# Patient Record
Sex: Male | Born: 1985 | Race: Black or African American | Hispanic: No | Marital: Single | Smoking: Current some day smoker
Health system: Southern US, Community
[De-identification: ages and names within clinical notes are randomized; demographics above are authoritative.]

## PROBLEM LIST (undated history)

## (undated) DIAGNOSIS — T8859XA Other complications of anesthesia, initial encounter: Secondary | ICD-10-CM

## (undated) DIAGNOSIS — Z8489 Family history of other specified conditions: Secondary | ICD-10-CM

## (undated) DIAGNOSIS — Z789 Other specified health status: Secondary | ICD-10-CM

## (undated) HISTORY — PX: TYMPANOSTOMY TUBE PLACEMENT: SHX32

## (undated) HISTORY — PX: TONSILLECTOMY: SUR1361

## (undated) HISTORY — PX: KNEE SURGERY: SHX244

---

## 2013-08-10 ENCOUNTER — Encounter (HOSPITAL_COMMUNITY): Payer: Self-pay | Admitting: Emergency Medicine

## 2013-08-10 ENCOUNTER — Emergency Department (HOSPITAL_COMMUNITY)
Admission: EM | Admit: 2013-08-10 | Discharge: 2013-08-10 | Disposition: A | Discharge status: Home / Self Care | Payer: BC Managed Care – PPO | Attending: Emergency Medicine | Admitting: Emergency Medicine

## 2013-08-10 DIAGNOSIS — Z791 Long term (current) use of non-steroidal anti-inflammatories (NSAID): Secondary | ICD-10-CM | POA: Insufficient documentation

## 2013-08-10 DIAGNOSIS — G5621 Lesion of ulnar nerve, right upper limb: Secondary | ICD-10-CM

## 2013-08-10 DIAGNOSIS — F17200 Nicotine dependence, unspecified, uncomplicated: Secondary | ICD-10-CM | POA: Insufficient documentation

## 2013-08-10 DIAGNOSIS — M5412 Radiculopathy, cervical region: Secondary | ICD-10-CM | POA: Insufficient documentation

## 2013-08-10 DIAGNOSIS — M542 Cervicalgia: Secondary | ICD-10-CM | POA: Insufficient documentation

## 2013-08-10 DIAGNOSIS — R209 Unspecified disturbances of skin sensation: Secondary | ICD-10-CM | POA: Insufficient documentation

## 2013-08-10 DIAGNOSIS — IMO0002 Reserved for concepts with insufficient information to code with codable children: Secondary | ICD-10-CM | POA: Insufficient documentation

## 2013-08-10 DIAGNOSIS — M5410 Radiculopathy, site unspecified: Secondary | ICD-10-CM

## 2013-08-10 MED ORDER — NAPROXEN 500 MG PO TABS: 500.0000 mg | ORAL_TABLET | Freq: Two times a day (BID) | ORAL | Status: DC

## 2013-08-10 MED ORDER — OXYCODONE-ACETAMINOPHEN 5-325 MG PO TABS: 1.0000 | ORAL_TABLET | Freq: Three times a day (TID) | ORAL | Status: DC | PRN

## 2013-08-10 NOTE — ED Notes (Signed)
Pt states that when he moves neck fast a sharp pain will shoot from his neck to his back, and now he states it hurts to make a fist with his right hand and hurts when finger tips on right hand are touched, no pain in left hand.

## 2013-08-10 NOTE — ED Provider Notes (Signed)
CSN: [id number]     Arrival date & time 08/10/13  4098 History   First MD Initiated Contact with Patient 08/10/13 0730     Chief Complaint  Patient presents with  . Back Pain    lower  . Hand Pain    right   (Consider location/radiation/quality/duration/timing/severity/associated sxs/prior Treatment) The history is provided by the patient. No language interpreter was used.  Fran Mcree is a 27 y/o M presenting to the ED with neck pain that has been ongoing for the past month with lower back pain and hand discomfort that has been ongoing since Saturday. Patient reported that on Saturday he was jumping up onto his truck and when he jumped onto his truck he noticed that he had pain radiating from his back to his legs bilaterally - reported that this lasted for only 2-3 minutes. Patient reported that neck pain started one month ago and stated that when he was playing basketball he looked up quickly and had neck pain. Patient reported that since Saturday he has been having tingling sensation to the right hand - radiating from the right elbow to the hand. Patient reported that in the morning the tingling and the numbness is worse. Patient reported that he feels a pressure in his hand - stated that as the day continues he does no feel that much discomfort. 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 Patient reported that he feels the tingling sensation in his small, ring, and long finger of the right hand mainly. Denied neck stiffness, fever, chills, loss of sensation, visual distortions, weakness, loss of grip, injuries, falls, dizziness, headaches. PCP none   History reviewed. No pertinent past medical history. Past Surgical History  Procedure Laterality Date  . Tonsillectomy    . Knee surgery Left    No family history on file. History  Substance Use Topics  . Smoking status: Current Some Day Smoker   Types: Cigars  . Smokeless tobacco: Not on file  . Alcohol Use: No    Review of Systems  Constitutional: Negative for fever and chills.  HENT: Positive for neck pain. Negative for neck stiffness.   Eyes: Negative for visual disturbance.  Neurological: Positive for numbness. Negative for dizziness, weakness and headaches.  All other systems reviewed and are negative.    Allergies  Review of patient's allergies indicates no known allergies.  Home Medications   Current Outpatient Rx  Name  Route  Sig  Dispense  Refill  . ibuprofen (ADVIL,MOTRIN) 200 MG tablet   Oral   Take 800 mg by mouth every 6 (six) hours as needed for pain.         . naproxen sodium (ANAPROX) 220 MG tablet   Oral   Take 220 mg by mouth 2 (two) times daily with a meal.         . naproxen (NAPROSYN) 500 MG tablet   Oral   Take 1 tablet (500 mg total) by mouth 2 (two) times daily.   30 tablet   0   . oxyCODONE-acetaminophen (PERCOCET/ROXICET) 5-325 MG per tablet   Oral   Take 1 tablet by mouth every 8 (eight) hours as needed for pain.   5 tablet   0    BP 148/74  Pulse 72  Temp(Src) 98.5 F (36.9 C) (Oral)  Resp 20  SpO2 99% Physical Exam  Nursing note and vitals reviewed. Constitutional: He is  oriented to person, place, and time. He appears well-developed and well-nourished. No distress.  HENT:  Head: Normocephalic and atraumatic.  Eyes: Conjunctivae and EOM are normal. Pupils are equal, round, and reactive to light. Right eye exhibits no discharge. Left eye exhibits no discharge.  Neck: Normal range of motion. Neck supple.  Negative neck stiffness  Negative nuchal rigidity  Cardiovascular: Normal rate, regular rhythm and normal heart sounds.  Exam reveals no friction rub.   No murmur heard. Pulses:      Radial pulses are 2+ on the right side, and 2+ on the left side.       Dorsalis pedis pulses are 2+ on the right side, and 2+ on the left side.  Pulmonary/Chest: Effort normal and  breath sounds normal. No respiratory distress. He has no wheezes. He has no rales.  Musculoskeletal: Normal range of motion. He exhibits no tenderness.       Back:  Negative swelling, erythema, inflammation, ecchymosis, deformities noted to the right upper extremity and back.  Full ROM to upper and lower extremities  Full motion of the spine noted without difficulty Mild discomfort upon palpation to the right side of the lumbosacral region - mainly muscular in nature.  Neurological: He is alert and oriented to person, place, and time. No cranial nerve deficit. He exhibits normal muscle tone. Coordination normal.  Cranial nerves III-XII grossly intact  Sensation intact to upper and lower extremities bilaterally with differentiation to sharp and dull touch  Strength 5+/5+ to upper and lower extremities bilaterally with resistance, equal distribution identified Strength intact to MCP, PIP, and DIP joints of the digits of the right hand Full sensation and strength intact to the right hand and digits.   Skin: Skin is warm and dry. No rash noted. He is not diaphoretic. No erythema.  Psychiatric: He has a normal mood and affect. His behavior is normal. Thought content normal.    ED Course  Procedures (including critical care time)  8:00 AM Discussed case with Dr. Blinda Leatherwood, reported that imaging is not warranted at this time since there was no actual trauma. Recommended that patient be discharged with pain medications and follow-up as an outpatient to be get further imaging performed if symptoms worsen or change.   8:30 AM Had a long discussion with patient regarding no imaging required at this juncture. Discussed with patient that his pain is predominantly radiculopathy, nerve pain. Discussed with patient to try and sleep with his arm not tucked underneath his head or bent, discussed with patient to try and keep the arm straight. Discussed with patient that he is to monitor symptoms closely. Discussed  with patient to follow-up with orthopedics and PCP - health and wellness - discussed possible need for MRI in the future if discomfort is to continue or change/worsen.   Labs Review Labs Reviewed - No data to display Imaging Review No results found.  MDM   1. Radiculopathy   2. Ulnar nerve impingement, right     Patient presenting to the ED with right hand pain and numbness sensation to the right long, ring and small finger that has been ongoing since Saturday. Patient reported that he has been experiencing lower back pain that is exacerbated with quick turning of the neck - reported that the pain radiates down the neck to the back. Denied fall, trauma, injuries. Alert and oriented. Full ROM to upper and lower extremities. Strength and sensation intact to upper and lower extremities. Strength with equal distribution noted. Full sensation  and strength to the right hand - full strength to the PIP, DIP, and MCP joints of the right hand. Pulses palpable and strong.  Discussed case with Dr. Blinda Leatherwood, as per physician did not recommend imaging at this time since there was no physical trauma. As per physician, cleared patient for discharge - recommended patient to be given pain medications.  Patient stable, afebrile. Suspicion to be radiculopathy and possible ulnar nerve impingement. Discharged patient with pain medication. Discussed with patient course and precautions. Discussed with patient to rest and stay hydrated. Referred patient to health and wellness center and orthopedics. Discussed with patient to continue to monitor symptoms and if symptoms are to worsen or change to report back to the ED - strict return instructions given.  Patient agreed to plan of care, understood, all questions answered.     Raymon Mutton, PA-C 08/11/13 2023

## 2013-08-12 NOTE — ED Provider Notes (Signed)
Medical screening examination/treatment/procedure(s) were performed by non-physician practitioner and as supervising physician I was immediately available for consultation/collaboration.    Jaxsen Bernhart J. Valery Chance, MD 08/12/13 0707 

## 2013-09-12 ENCOUNTER — Ambulatory Visit: Payer: BC Managed Care – PPO | Admitting: Internal Medicine

## 2019-08-06 ENCOUNTER — Other Ambulatory Visit: Payer: Self-pay

## 2019-08-06 DIAGNOSIS — Z20822 Contact with and (suspected) exposure to covid-19: Secondary | ICD-10-CM

## 2019-08-07 LAB — NOVEL CORONAVIRUS, NAA: SARS-CoV-2, NAA: NOT DETECTED

## 2019-09-07 ENCOUNTER — Other Ambulatory Visit: Payer: Self-pay | Admitting: Cardiology

## 2019-09-07 DIAGNOSIS — Z20822 Contact with and (suspected) exposure to covid-19: Secondary | ICD-10-CM

## 2019-09-09 LAB — NOVEL CORONAVIRUS, NAA: SARS-CoV-2, NAA: NOT DETECTED

## 2019-11-26 ENCOUNTER — Other Ambulatory Visit: Payer: Self-pay

## 2019-11-26 DIAGNOSIS — Z20822 Contact with and (suspected) exposure to covid-19: Secondary | ICD-10-CM

## 2019-11-27 LAB — NOVEL CORONAVIRUS, NAA: SARS-CoV-2, NAA: NOT DETECTED

## 2020-01-27 ENCOUNTER — Ambulatory Visit: Payer: Self-pay | Attending: Internal Medicine

## 2020-01-27 DIAGNOSIS — Z23 Encounter for immunization: Secondary | ICD-10-CM

## 2020-01-27 NOTE — Progress Notes (Signed)
   Covid-19 Vaccination Clinic  Name:  Riley Ross    MRN: [id number] DOB: [birth year]  01/27/2020  Mr. [name] was observed post Covid-19 immunization for 15 minutes without incident. He was provided with Vaccine Information Sheet and instruction to access the V-Safe system.   Mr. [name] was instructed to call 911 with any severe reactions post vaccine: Marland Kitchen Difficulty breathing  . Swelling of face and throat  . A fast heartbeat  . A bad rash all over body  . Dizziness and weakness   Immunizations Administered    Name Date Dose VIS Date Route   Pfizer COVID-19 Vaccine 01/27/2020  3:13 PM 0.3 mL 10/21/2019 Intramuscular   Manufacturer: [employer], [employer]   Lot: [id number]   NDC: [address]-0

## 2020-02-22 ENCOUNTER — Ambulatory Visit: Payer: Self-pay | Attending: Internal Medicine

## 2020-02-22 DIAGNOSIS — Z23 Encounter for immunization: Secondary | ICD-10-CM

## 2020-02-22 NOTE — Progress Notes (Signed)
   Covid-19 Vaccination Clinic  Name:  Riley Ross    MRN: [id number] DOB: 1986/09/01  02/22/2020  Riley Ross was observed post Covid-19 immunization for 15 minutes without incident. He was provided with Vaccine Information Sheet and instruction to access the V-Safe system.   Riley Ross was instructed to call 911 with any severe reactions post vaccine: Marland Kitchen Difficulty breathing  . Swelling of face and throat  . A fast heartbeat  . A bad rash all over body  . Dizziness and weakness   Immunizations Administered    Name Date Dose VIS Date Route   Pfizer COVID-19 Vaccine 02/22/2020  1:04 PM 0.3 mL 10/21/2019 Intramuscular   Manufacturer: [employer], [employer]   Lot: [id number]   NDC: [address]-8

## 2020-07-23 ENCOUNTER — Other Ambulatory Visit: Payer: Self-pay

## 2020-07-23 ENCOUNTER — Encounter (HOSPITAL_COMMUNITY): Payer: Self-pay

## 2020-07-23 ENCOUNTER — Ambulatory Visit (HOSPITAL_COMMUNITY)
Admission: EM | Admit: 2020-07-23 | Discharge: 2020-07-23 | Disposition: A | Discharge status: Home / Self Care | Payer: BC Managed Care – PPO | Attending: Family Medicine | Admitting: Family Medicine

## 2020-07-23 DIAGNOSIS — G5602 Carpal tunnel syndrome, left upper limb: Secondary | ICD-10-CM

## 2020-07-23 DIAGNOSIS — M5412 Radiculopathy, cervical region: Secondary | ICD-10-CM

## 2020-07-23 MED ORDER — PREDNISONE 20 MG PO TABS: 40.0000 mg | ORAL_TABLET | Freq: Every day | ORAL | 0 refills | Status: AC

## 2020-07-23 MED ORDER — TIZANIDINE HCL 4 MG PO TABS: 4.0000 mg | ORAL_TABLET | Freq: Two times a day (BID) | ORAL | 0 refills | Status: DC | PRN

## 2020-07-23 NOTE — ED Triage Notes (Signed)
Pt presents with  neck pain X 1 week with some left side arm tingling.

## 2020-07-23 NOTE — Discharge Instructions (Signed)
Splint left wrist at night and elevate until you are evaluated by neurology.  Start prednisone 40 mg daily with breakfast.  Tizanidine 4 mg bedtime and may take once during the day if you are  not driving.   You have been referred to [facility]rology for nerve conduction testing and further work-up of symptoms.

## 2020-07-23 NOTE — ED Provider Notes (Signed)
[facility]    CSN: [id number] Arrival date & time: 07/23/20  0805      History   Chief Complaint Chief Complaint  Patient presents with  . Neck Pain  . Arm Tingling    HPI Riley Ross is a 34 y.o. male.   HPI  Patient with a history of intermittent neck pain presents for evaluation of mid cervical neck pain with radiating into the left arm and now thumb and index finger numbness. 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 Describes pain as tingling with numbness. No prior history of carpal tunnel or any known injury.He has applied heat.  He has full range of motion although endorses tenderness with hyperflexion and extension of the neck.  He is not experiencing any back pain or chest pain.   History reviewed. No pertinent past medical history.  There are no problems to display for this patient.   Past Surgical History:  Procedure Laterality Date  . KNEE SURGERY Left   . TONSILLECTOMY         Home Medications    Prior to Admission medications   Medication Sig Start Date End Date Taking? Authorizing Provider  ibuprofen (ADVIL,MOTRIN) 200 MG tablet Take 800 mg by mouth every 6 (six) hours as needed for pain.    [provider]  naproxen (NAPROSYN) 500 MG tablet Take 1 tablet (500 mg total) by mouth 2 (two) times daily. 08/10/13   Sciacca, Marissa, PA-C  naproxen sodium (ANAPROX) 220 MG tablet Take 220 mg by mouth 2 (two) times daily with a meal.    [provider]  oxyCODONE-acetaminophen (PERCOCET/ROXICET) 5-325 MG per tablet Take 1 tablet by mouth every 8 (eight) hours as needed for pain. 08/10/13   Raymon Mutton, PA-C    Family History Family History  Family history unknown: Yes    Social History Social History   Tobacco Use  . Smoking status: Current Some Day Smoker    Types: Cigars  Substance Use Topics  . Alcohol use: No  . Drug use: Not on file     Allergies   Patient has no known  allergies.   Review of Systems Review of Systems Pertinent negatives listed in HPI  Physical Exam Triage Vital Signs ED Triage Vitals  Enc Vitals Group     BP 07/23/20 0824 130/87     Pulse Rate 07/23/20 0824 73     Resp 07/23/20 0824 18     Temp 07/23/20 0824 98.4 F (36.9 C)     Temp Source 07/23/20 0824 Oral     SpO2 07/23/20 0824 100 %     Weight --      Height --      Head Circumference --      Peak Flow --      Pain Score 07/23/20 0822 6     Pain Loc --      Pain Edu? --      Excl. in GC? --    No data found.  Updated Vital Signs BP 130/87 (BP Location: Left Arm)   Pulse 73   Temp 98.4 F (36.9 C) (Oral)   Resp 18   SpO2 100%   Visual Acuity Right Eye Distance:   Left Eye Distance:   Bilateral Distance:    Right Eye Near:   Left Eye Near:    Bilateral Near:     Physical Exam Eyes:     Extraocular Movements: Extraocular movements intact.  Pupils: Pupils are equal, round, and reactive to light.  Cardiovascular:     Rate and Rhythm: Normal rate and regular rhythm.     Pulses: Normal pulses.     Heart sounds: Normal heart sounds.  Musculoskeletal:     Cervical back: Rigidity and torticollis present. Spinous process tenderness present. Decreased range of motion.  Lymphadenopathy:     Cervical: Cervical adenopathy present.  Skin:    General: Skin is warm and dry.     Capillary Refill: Capillary refill takes less than 2 seconds.  Neurological:     General: No focal deficit present.     Mental Status: He is alert and oriented to person, place, and time.      UC Treatments / Results  Labs (all labs ordered are listed, but only abnormal results are displayed) Labs Reviewed - No data to display  EKG   Radiology No results found.  Procedures Procedures (including critical care time)  Medications Ordered in UC Medications - No data to display  Initial Impression / Assessment and Plan / UC Course  I have reviewed the triage vital signs  and the nursing notes.  Pertinent labs & imaging results that were available during my care of the patient were reviewed by me and considered in my medical decision making (see chart for details).      Patient referred emergently to [facility] neurology given radicular symptoms involving the cervical spine radiating into the left extremity with subsequent numbness of the thumb and index finger.  Will trial treatment of prednisone and tizanidine with pain.  Patient would benefit from having nerve conduction testing to rule out impingement syndrome or carpal tunnel requiring intervention.  Patient is stable agrees with plan.  Final Clinical Impressions(s) / UC Diagnoses   Final diagnoses:  Carpal tunnel syndrome of left wrist  Cervical radiculopathy     Discharge Instructions     Splint left wrist at night and elevate until you are evaluated by neurology.  Start prednisone 40 mg daily with breakfast.  Tizanidine 4 mg bedtime and may take once during the day if you are  not driving.   You have been referred to [facility]rology for nerve conduction testing and further work-up of symptoms.    ED Prescriptions    Medication Sig Dispense Auth. Provider   predniSONE (DELTASONE) 20 MG tablet Take 2 tablets (40 mg total) by mouth daily with breakfast for 5 days. 10 tablet Bing Neighbors, FNP   tiZANidine (ZANAFLEX) 4 MG tablet Take 1 tablet (4 mg total) by mouth 2 (two) times daily as needed for muscle spasms. 30 tablet Bing Neighbors, FNP     PDMP not reviewed this encounter.   Bing Neighbors, [place] 07/23/20 [phone]

## 2020-07-25 ENCOUNTER — Encounter: Payer: Self-pay | Admitting: Diagnostic Neuroimaging

## 2020-07-25 ENCOUNTER — Ambulatory Visit: Payer: BC Managed Care – PPO | Admitting: Diagnostic Neuroimaging

## 2020-07-25 VITALS — BP 131/79 | HR 84 | Ht 70.0 in | Wt 262.0 lb

## 2020-07-25 DIAGNOSIS — M79642 Pain in left hand: Secondary | ICD-10-CM

## 2020-07-25 DIAGNOSIS — M5412 Radiculopathy, cervical region: Secondary | ICD-10-CM | POA: Diagnosis not present

## 2020-07-25 DIAGNOSIS — R29898 Other symptoms and signs involving the musculoskeletal system: Secondary | ICD-10-CM

## 2020-07-25 MED ORDER — GABAPENTIN 300 MG PO CAPS: 300.0000 mg | ORAL_CAPSULE | Freq: Every day | ORAL | 1 refills | Status: AC

## 2020-07-25 NOTE — Patient Instructions (Signed)
LEFT CERVICAL RADICULOPATHY (numbness, weakness, pain) - check MRI cervical spine  - continue prednisone and tizanidine  - trial of gabapentin 300mg  at bedtime  - refer to occupational therapy

## 2020-07-25 NOTE — Progress Notes (Signed)
[facility]  PATIENT: Riley Ross DOB: 1986/11/10  REFERRING CLINICIAN: Bing Neighbors, FNP HISTORY FROM: patient  REASON FOR VISIT: new consult    HISTORICAL  CHIEF COMPLAINT:  Chief Complaint  Patient presents with  . Neck Pain    rm 7 New Pt "neck pain x 1 month, numbness/tingling/ pins/needles in left arm/hand x several days; no history of injuries, accidents"  . Numbness    HISTORY OF PRESENT ILLNESS:   34 year old male here for evaluation of neck pain rating to left arm.  Symptoms started about 1 month ago and have worsened since that time.  In the past 1 week he has had significant pain and numbness radiating down his left arm into his left hand digits 1 and 2.  He has numbness, tingling pain and weakness in his hand.  No prodromal accidents injuries or traumas.  No problems with right arm.  No problems with legs, bowel or bladder control.  No vision, speech or swallowing issues.  Patient went to urgent care and was evaluated.  He was prescribed prednisone and tizanidine without relief a few days ago.  REVIEW OF SYSTEMS: Full 14 system review of systems performed and negative with exception of: As per HPI.  ALLERGIES: No Known Allergies  HOME MEDICATIONS: Outpatient Medications Prior to Visit  Medication Sig Dispense Refill  . predniSONE (DELTASONE) 20 MG tablet Take 2 tablets (40 mg total) by mouth daily with breakfast for 5 days. 10 tablet 0  . tiZANidine (ZANAFLEX) 4 MG tablet Take 1 tablet (4 mg total) by mouth 2 (two) times daily as needed for muscle spasms. 30 tablet 0  . ibuprofen (ADVIL,MOTRIN) 200 MG tablet Take 800 mg by mouth every 6 (six) hours as needed for pain.    . naproxen (NAPROSYN) 500 MG tablet Take 1 tablet (500 mg total) by mouth 2 (two) times daily. 30 tablet 0  . naproxen sodium (ANAPROX) 220 MG tablet Take 220 mg by mouth 2 (two) times daily with a meal.    . oxyCODONE-acetaminophen (PERCOCET/ROXICET) 5-325 MG per tablet  Take 1 tablet by mouth every 8 (eight) hours as needed for pain. 5 tablet 0   No facility-administered medications prior to visit.    PAST MEDICAL HISTORY: No past medical history on file.  PAST SURGICAL HISTORY: Past Surgical History:  Procedure Laterality Date  . KNEE SURGERY Left    knee cap  . TONSILLECTOMY      FAMILY HISTORY: Family History  Problem Relation Age of Onset  . Kidney disease Mother   . Carpal tunnel syndrome Mother   . Seizures Brother   . Lung disease Maternal Grandmother     SOCIAL HISTORY: Social History   Socioeconomic History  . Marital status: Single    Spouse name: Not on file  . Number of children: 0  . Years of education: Not on file  . Highest education level: Bachelor's degree (e.g., BA, AB, BS)  Occupational History    Comment: [employer]  Tobacco Use  . Smoking status: Current Some Day Smoker    Types: Cigars  . Smokeless tobacco: Never Used  Substance and Sexual Activity  . Alcohol use: No  . 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 Sexual activity: Not on file  Other Topics Concern  . Not on file  Social History Narrative   Caffeine- occassionally, not daily   Social Determinants of Health   Financial  Resource Strain:   . Difficulty of Paying Living Expenses: Not on file  Food Insecurity:   . Worried About Programme researcher, broadcasting/film/video in the Last Year: Not on file  . Ran Out of Food in the Last Year: Not on file  Transportation Needs:   . Lack of Transportation (Medical): Not on file  . Lack of Transportation (Non-Medical): Not on file  Physical Activity:   . Days of Exercise per Week: Not on file  . Minutes of Exercise per Session: Not on file  Stress:   . Feeling of Stress : Not on file  Social Connections:   . Frequency of Communication with Friends and Family: Not on file  . Frequency of Social Gatherings with Friends and Family: Not on file  . Attends Religious Services: Not on  file  . Active Member of Clubs or Organizations: Not on file  . Attends Banker Meetings: Not on file  . Marital Status: Not on file  Intimate Partner Violence:   . Fear of Current or Ex-Partner: Not on file  . Emotionally Abused: Not on file  . Physically Abused: Not on file  . Sexually Abused: Not on file     PHYSICAL EXAM  GENERAL EXAM/CONSTITUTIONAL: Vitals:  Vitals:   07/25/20 0803  BP: 131/79  Pulse: 84  Weight: 262 lb (118.8 kg)  Height: 5\' 10"  (1.778 m)     Body mass index is 37.59 kg/m. Wt Readings from Last 3 Encounters:  07/25/20 262 lb (118.8 kg)     Patient is in no distress; well developed, nourished and groomed  DECR NECK ROM ON FLEX AND EXT  CARDIOVASCULAR:  Examination of carotid arteries is normal; no carotid bruits  Regular rate and rhythm, no murmurs  Examination of peripheral vascular system by observation and palpation is normal  EYES:  Ophthalmoscopic exam of optic discs and posterior segments is normal; no papilledema or hemorrhages  No exam data present  MUSCULOSKELETAL:  Gait, strength, tone, movements noted in Neurologic exam below  NEUROLOGIC: MENTAL STATUS:  No flowsheet data found.  awake, alert, oriented to person, place and time  recent and remote memory intact  normal attention and concentration  language fluent, comprehension intact, naming intact  fund of knowledge appropriate  CRANIAL NERVE:   2nd - no papilledema on fundoscopic exam  2nd, 3rd, 4th, 6th - pupils equal and reactive to light, visual fields full to confrontation, extraocular muscles intact, no nystagmus  5th - facial sensation symmetric  7th - facial strength symmetric  8th - hearing intact  9th - palate elevates symmetrically, uvula midline  11th - shoulder shrug symmetric  12th - tongue protrusion midline  MOTOR:   normal bulk and tone, full strength in the BUE, BLE; EXCEPT LEFT HAND FINGER FLEXION  SENSORY:    normal and symmetric to light touch, temperature, vibration; EXCEPT DECR IN LEFT HAND DIGIT 1-2  COORDINATION:   finger-nose-finger, fine finger movements normal  REFLEXES:   deep tendon reflexes TRACE and symmetric  GAIT/STATION:   narrow based gait     DIAGNOSTIC DATA (LABS, IMAGING, TESTING) - I reviewed patient records, labs, notes, testing and imaging myself where available.  No results found for: WBC, HGB, HCT, MCV, PLT No results found for: NA, K, CL, CO2, GLUCOSE, BUN, CREATININE, CALCIUM, PROT, ALBUMIN, AST, ALT, ALKPHOS, BILITOT, GFRNONAA, GFRAA No results found for: CHOL, HDL, LDLCALC, LDLDIRECT, TRIG, CHOLHDL No results found for: 07/27/20 No results found for: VITAMINB12 No results found  for: TSH    ASSESSMENT AND PLAN  34 y.o. year old male here with:  Dx:  1. Left cervical radiculopathy   2. Left hand pain   3. Left hand weakness     PLAN:  LEFT CERVICAL RADICULOPATHY (since past 1 month, worse in the past 1 week; numbness, weakness, pain) - check MRI cervical spine (rule out disc herniation or demyelinating dz) - continue prednisone and tizanidine - trial of gabapentin 300mg  at bedtime  Orders Placed This Encounter  Procedures  . MR CERVICAL SPINE W WO CONTRAST  . Ambulatory referral to Occupational Therapy   Meds ordered this encounter  Medications  . gabapentin (NEURONTIN) 300 MG capsule    Sig: Take 1 capsule (300 mg total) by mouth at bedtime.    Dispense:  60 capsule    Refill:  1   Return for pending if symptoms worsen or fail to improve.    , MD 07/25/2020, 8:58 AM Certified in Neurology, Neurophysiology and Neuroimaging  [facility] [address] [place], [place] [phone]

## 2020-07-26 ENCOUNTER — Telehealth: Payer: Self-pay | Admitting: Diagnostic Neuroimaging

## 2020-07-26 NOTE — Telephone Encounter (Signed)
LVM for pt to call back about scheduling mri  [employer] Auth: [id number] (exp. 07/26/20 to 01/21/21)

## 2020-07-26 NOTE — Telephone Encounter (Signed)
spoke to the patient he requested the weekend i told him the order will be sent to GI.

## 2020-07-30 NOTE — Telephone Encounter (Signed)
Patient called stating that he would like to come to [facility] because he can get a sooner appt.. patient is scheduled at [facility] for 07/31/20.

## 2020-07-31 ENCOUNTER — Ambulatory Visit: Payer: BC Managed Care – PPO

## 2020-07-31 ENCOUNTER — Other Ambulatory Visit: Payer: Self-pay

## 2020-07-31 DIAGNOSIS — R29898 Other symptoms and signs involving the musculoskeletal system: Secondary | ICD-10-CM

## 2020-07-31 DIAGNOSIS — M79642 Pain in left hand: Secondary | ICD-10-CM | POA: Diagnosis not present

## 2020-07-31 DIAGNOSIS — M5412 Radiculopathy, cervical region: Secondary | ICD-10-CM

## 2020-07-31 MED ORDER — GADOBENATE DIMEGLUMINE 529 MG/ML IV SOLN
20.0000 mL | Freq: Once | INTRAVENOUS | Status: AC | PRN
  Administered 2020-07-31: 17:00:00 20 mL via INTRAVENOUS

## 2020-08-02 ENCOUNTER — Telehealth: Payer: Self-pay | Admitting: Diagnostic Neuroimaging

## 2020-08-02 DIAGNOSIS — M5412 Radiculopathy, cervical region: Secondary | ICD-10-CM

## 2020-08-02 DIAGNOSIS — M4802 Spinal stenosis, cervical region: Secondary | ICD-10-CM

## 2020-08-02 NOTE — Telephone Encounter (Signed)
I called patient with MRI results:  MRI cervical spine (with and without) demonstrating: - At C4-5 posterior central disc protrusion resulting in severe spinal stenosis and no foraminal stenosis. - At C5-6 disc bulging and facet hypertrophy resulting in moderate spinal stenosis and severe right foraminal stenosis; myelomalacia signal changes noted. - At C6-7 disc bulging and uncovertebral joint hypertrophy resulting in mild bilateral foraminal stenosis.   Recommend neurosurgery consult for further evaluation and treatment.  Patient's neck pain is stable.  He still has persistent numbness in his left hand.  Orders Placed This Encounter  Procedures  . Ambulatory referral to Neurosurgery    Suanne Marker, MD 08/02/2020, 5:52 PM Certified in Neurology, Neurophysiology and Neuroimaging  [facility] [address] [place], [place] [address] [phone]

## 2020-08-23 ENCOUNTER — Other Ambulatory Visit: Payer: Self-pay | Admitting: Neurological Surgery

## 2020-08-24 ENCOUNTER — Other Ambulatory Visit: Payer: Self-pay | Admitting: Neurological Surgery

## 2020-08-28 ENCOUNTER — Other Ambulatory Visit: Payer: Self-pay | Admitting: Neurological Surgery

## 2020-08-28 ENCOUNTER — Other Ambulatory Visit (HOSPITAL_COMMUNITY): Payer: Self-pay | Admitting: Neurological Surgery

## 2020-08-28 DIAGNOSIS — M4802 Spinal stenosis, cervical region: Secondary | ICD-10-CM

## 2020-08-29 ENCOUNTER — Other Ambulatory Visit: Payer: Self-pay

## 2020-08-29 ENCOUNTER — Ambulatory Visit (HOSPITAL_COMMUNITY)
Admission: RE | Admit: 2020-08-29 | Discharge: 2020-08-29 | Disposition: A | Discharge status: Home / Self Care | Payer: BC Managed Care – PPO | Source: Ambulatory Visit | Attending: Neurological Surgery | Admitting: Neurological Surgery

## 2020-08-29 DIAGNOSIS — M4802 Spinal stenosis, cervical region: Secondary | ICD-10-CM | POA: Diagnosis not present

## 2020-09-13 NOTE — Pre-Procedure Instructions (Signed)
[id number] [place] Otto, [place] - [address] AT [facility] [address] [facility] [address] [place] [address] Phone: [phone] Fax: [phone]     Your procedure is scheduled on Tuesday November 9th.  Report to [facility] Main Entrance "A" at 5:30 A.M., and check in at the Admitting office.  Call this number if you have problems the morning of surgery:  [phone]  Call [phone] if you have any questions prior to your surgery date Monday-Friday 8am-4pm    Remember:  Do not eat or drink after midnight the night before your surgery     Take these medicines the morning of surgery with A SIP OF WATER - None  As of today, STOP taking any Aspirin (unless otherwise instructed by your surgeon) Aleve, Naproxen, Ibuprofen, Motrin, Advil, Goody's, BC's, all herbal medications, fish oil, and all vitamins.                      Do not wear jewelry, make up, or nail polish            Do not wear lotions, powders, perfumes/colognes, or deodorant.            Do not shave 48 hours prior to surgery.  Men may shave face and neck.            Do not bring valuables to the hospital.            [facility] is not responsible for any belongings or valuables.  Do NOT Smoke (Tobacco/Vaping) or drink Alcohol 24 hours prior to your procedure If you use a CPAP at night, you may bring all equipment for your overnight stay.   Contacts, glasses, dentures or bridgework may not be worn into surgery.      For patients admitted to the hospital, discharge time will be determined by your treatment team.   Patients discharged the day of surgery will not be allowed to drive home, and someone needs to stay with them for 24 hours.    Special instructions:   [place]- Preparing For Surgery  Before surgery, you can play an important role. Because skin is not sterile, your skin needs to be as free of germs as possible. You can reduce the number of germs on your skin by washing  with CHG (chlorahexidine gluconate) Soap before surgery.  CHG is an antiseptic cleaner which kills germs and bonds with the skin to continue killing germs even after washing.    Oral Hygiene is also important to reduce your risk of infection.  Remember - BRUSH YOUR TEETH THE MORNING OF SURGERY WITH YOUR REGULAR TOOTHPASTE  Please do not use if you have an allergy to CHG or antibacterial soaps. If your skin becomes reddened/irritated stop using the CHG.  Do not shave (including legs and underarms) for at least 48 hours prior to first CHG shower. It is OK to shave your face.  Please follow these instructions carefully.   1. Shower the NIGHT BEFORE SURGERY and the MORNING OF SURGERY with CHG Soap.   2. If you chose to wash your hair, wash your hair first as usual with your normal shampoo.  3. After you shampoo, rinse your hair and body thoroughly to remove the shampoo.  4. Use CHG as you would any other liquid soap. You can apply CHG directly to the skin and wash gently with a scrungie or a clean washcloth.   5.  Apply the CHG Soap to your body ONLY FROM THE NECK DOWN.  Do not use on open wounds or open sores. Avoid contact with your eyes, ears, mouth and genitals (private parts). Wash Face and genitals (private parts)  with your normal soap.   6. Wash thoroughly, paying special attention to the area where your surgery will be performed.  7. Thoroughly rinse your body with warm water from the neck down.  8. DO NOT shower/wash with your normal soap after using and rinsing off the CHG Soap.  9. Pat yourself dry with a CLEAN TOWEL.  10. Wear CLEAN PAJAMAS to bed the night before surgery  11. Place CLEAN SHEETS on your bed the night of your first shower and DO NOT SLEEP WITH PETS.   Day of Surgery: Wear Clean/Comfortable clothing the morning of surgery Do not apply any deodorants/lotions.   Remember to brush your teeth WITH YOUR REGULAR TOOTHPASTE.   Please read over the following fact  sheets that you were given.

## 2020-09-14 ENCOUNTER — Other Ambulatory Visit: Payer: Self-pay

## 2020-09-14 ENCOUNTER — Other Ambulatory Visit (HOSPITAL_COMMUNITY)
Admission: RE | Admit: 2020-09-14 | Discharge: 2020-09-14 | Disposition: A | Discharge status: Home / Self Care | Payer: BC Managed Care – PPO | Source: Ambulatory Visit | Attending: Neurological Surgery | Admitting: Neurological Surgery

## 2020-09-14 ENCOUNTER — Encounter (HOSPITAL_COMMUNITY)
Admission: RE | Admit: 2020-09-14 | Discharge: 2020-09-14 | Disposition: A | Discharge status: Home / Self Care | Payer: BC Managed Care – PPO | Source: Ambulatory Visit | Attending: Neurological Surgery | Admitting: Neurological Surgery

## 2020-09-14 ENCOUNTER — Encounter (HOSPITAL_COMMUNITY): Payer: Self-pay

## 2020-09-14 DIAGNOSIS — Z20822 Contact with and (suspected) exposure to covid-19: Secondary | ICD-10-CM | POA: Insufficient documentation

## 2020-09-14 DIAGNOSIS — Z01812 Encounter for preprocedural laboratory examination: Secondary | ICD-10-CM | POA: Insufficient documentation

## 2020-09-14 HISTORY — DX: Family history of other specified conditions: Z84.89

## 2020-09-14 HISTORY — DX: Other specified health status: Z78.9

## 2020-09-14 HISTORY — DX: Other complications of anesthesia, initial encounter: T88.59XA

## 2020-09-14 LAB — CBC
HCT: 46.3 % (ref 39.0–52.0)
Hemoglobin: 14.8 g/dL (ref 13.0–17.0)
MCH: 28.7 pg (ref 26.0–34.0)
MCHC: 32 g/dL (ref 30.0–36.0)
MCV: 89.7 fL (ref 80.0–100.0)
Platelets: 252 10*3/uL (ref 150–400)
RBC: 5.16 MIL/uL (ref 4.22–5.81)
RDW: 12.1 % (ref 11.5–15.5)
WBC: 4.4 10*3/uL (ref 4.0–10.5)
nRBC: 0 % (ref 0.0–0.2)

## 2020-09-14 LAB — SARS CORONAVIRUS 2 (TAT 6-24 HRS): SARS Coronavirus 2: NEGATIVE

## 2020-09-14 LAB — SURGICAL PCR SCREEN
MRSA, PCR: NEGATIVE
Staphylococcus aureus: NEGATIVE

## 2020-09-14 LAB — PROTIME-INR
INR: 1 (ref 0.8–1.2)
Prothrombin Time: 13 seconds (ref 11.4–15.2)

## 2020-09-14 NOTE — Progress Notes (Signed)
PCP - no PCP per patient Cardiologist - patient denies  PPM/ICD - n/a Device Orders -  Rep Notified -   Chest x-ray - n/a EKG - n/a Stress Test - patient denies ECHO - patient denies Cardiac Cath - patient denies  Sleep Study - patient denies CPAP -   Fasting Blood Sugar - n/a Checks Blood Sugar _____ times a day  Blood Thinner Instructions: n/a Aspirin Instructions: n/a  ERAS Protcol - n/a PRE-SURGERY Ensure or G2-   COVID TEST- 09/14/2020   Anesthesia review: n/a  Patient denies shortness of breath, fever, cough and chest pain at PAT appointment   All instructions explained to the patient, with a verbal understanding of the material. Patient agrees to go over the instructions while at home for a better understanding. Patient also instructed to self quarantine after being tested for COVID-19. The opportunity to ask questions was provided.

## 2020-09-17 MED ORDER — DEXTROSE 5 % IV SOLN
3.0000 g | INTRAVENOUS | Status: DC
  Filled 2020-09-17: qty 3000

## 2020-09-18 ENCOUNTER — Encounter (HOSPITAL_COMMUNITY)
Admission: RE | Disposition: A | Discharge status: Home / Self Care | Payer: Self-pay | Source: Ambulatory Visit | Attending: Neurological Surgery

## 2020-09-18 ENCOUNTER — Encounter (HOSPITAL_COMMUNITY): Payer: Self-pay | Admitting: Neurological Surgery

## 2020-09-18 ENCOUNTER — Ambulatory Visit (HOSPITAL_COMMUNITY): Payer: BC Managed Care – PPO

## 2020-09-18 ENCOUNTER — Ambulatory Visit (HOSPITAL_COMMUNITY): Payer: BC Managed Care – PPO | Admitting: Anesthesiology

## 2020-09-18 ENCOUNTER — Other Ambulatory Visit: Payer: Self-pay

## 2020-09-18 ENCOUNTER — Observation Stay (HOSPITAL_COMMUNITY)
Admission: RE | Admit: 2020-09-18 | Discharge: 2020-09-19 | Disposition: A | Discharge status: Home / Self Care | Payer: BC Managed Care – PPO | Source: Ambulatory Visit | Attending: Neurological Surgery | Admitting: Neurological Surgery

## 2020-09-18 DIAGNOSIS — Z419 Encounter for procedure for purposes other than remedying health state, unspecified: Secondary | ICD-10-CM

## 2020-09-18 DIAGNOSIS — G959 Disease of spinal cord, unspecified: Secondary | ICD-10-CM | POA: Diagnosis present

## 2020-09-18 DIAGNOSIS — M542 Cervicalgia: Secondary | ICD-10-CM | POA: Diagnosis present

## 2020-09-18 DIAGNOSIS — M4722 Other spondylosis with radiculopathy, cervical region: Principal | ICD-10-CM | POA: Insufficient documentation

## 2020-09-18 DIAGNOSIS — M4712 Other spondylosis with myelopathy, cervical region: Secondary | ICD-10-CM | POA: Diagnosis not present

## 2020-09-18 DIAGNOSIS — F17290 Nicotine dependence, other tobacco product, uncomplicated: Secondary | ICD-10-CM | POA: Insufficient documentation

## 2020-09-18 HISTORY — PX: ANTERIOR CERVICAL DECOMP/DISCECTOMY FUSION: SHX1161

## 2020-09-18 SURGERY — ANTERIOR CERVICAL DECOMPRESSION/DISCECTOMY FUSION 2 LEVELS
Anesthesia: General

## 2020-09-18 MED ORDER — THROMBIN 5000 UNITS EX SOLR
CUTANEOUS | Status: AC
  Filled 2020-09-18: qty 15000

## 2020-09-18 MED ORDER — PROPOFOL 10 MG/ML IV BOLUS
INTRAVENOUS | Status: DC | PRN
  Administered 2020-09-18: 200 mg via INTRAVENOUS

## 2020-09-18 MED ORDER — TRIAMCINOLONE ACETONIDE 40 MG/ML IJ SUSP: INTRAMUSCULAR | Status: DC | PRN

## 2020-09-18 MED ORDER — CHLORHEXIDINE GLUCONATE 0.12 % MT SOLN
OROMUCOSAL | Status: AC
  Administered 2020-09-18: 15 mL via OROMUCOSAL
  Filled 2020-09-18: qty 15

## 2020-09-18 MED ORDER — ACETAMINOPHEN 10 MG/ML IV SOLN: 1000.0000 mg | Freq: Once | INTRAVENOUS | Status: DC | PRN

## 2020-09-18 MED ORDER — CHLORHEXIDINE GLUCONATE CLOTH 2 % EX PADS: 6.0000 | MEDICATED_PAD | Freq: Once | CUTANEOUS | Status: DC

## 2020-09-18 MED ORDER — SUCCINYLCHOLINE CHLORIDE 200 MG/10ML IV SOSY
PREFILLED_SYRINGE | INTRAVENOUS | Status: AC
  Filled 2020-09-18: qty 10

## 2020-09-18 MED ORDER — METHYLENE BLUE 0.5 % INJ SOLN
INTRAVENOUS | Status: AC
  Filled 2020-09-18: qty 10

## 2020-09-18 MED ORDER — DEXAMETHASONE SODIUM PHOSPHATE 10 MG/ML IJ SOLN
INTRAMUSCULAR | Status: DC | PRN
  Administered 2020-09-18: 10 mg via INTRAVENOUS

## 2020-09-18 MED ORDER — SODIUM CHLORIDE 0.9% FLUSH
3.0000 mL | Freq: Two times a day (BID) | INTRAVENOUS | Status: DC
  Administered 2020-09-18: 3 mL via INTRAVENOUS

## 2020-09-18 MED ORDER — ONDANSETRON HCL 4 MG PO TABS: 4.0000 mg | ORAL_TABLET | Freq: Four times a day (QID) | ORAL | Status: DC | PRN

## 2020-09-18 MED ORDER — LIDOCAINE 2% (20 MG/ML) 5 ML SYRINGE
INTRAMUSCULAR | Status: AC
  Filled 2020-09-18: qty 5

## 2020-09-18 MED ORDER — MIDAZOLAM HCL 5 MG/5ML IJ SOLN
INTRAMUSCULAR | Status: DC | PRN
  Administered 2020-09-18: 2 mg via INTRAVENOUS

## 2020-09-18 MED ORDER — GABAPENTIN 300 MG PO CAPS
300.0000 mg | ORAL_CAPSULE | Freq: Every day | ORAL | Status: DC
  Administered 2020-09-18: 300 mg via ORAL
  Filled 2020-09-18: qty 1

## 2020-09-18 MED ORDER — PROPOFOL 10 MG/ML IV BOLUS
INTRAVENOUS | Status: AC
  Filled 2020-09-18: qty 20

## 2020-09-18 MED ORDER — THROMBIN 5000 UNITS EX SOLR
OROMUCOSAL | Status: DC | PRN
  Administered 2020-09-18: 5 mL via TOPICAL

## 2020-09-18 MED ORDER — ACETAMINOPHEN 650 MG RE SUPP: 650.0000 mg | RECTAL | Status: DC | PRN

## 2020-09-18 MED ORDER — ONDANSETRON HCL 4 MG/2ML IJ SOLN
INTRAMUSCULAR | Status: DC | PRN
  Administered 2020-09-18: 4 mg via INTRAVENOUS

## 2020-09-18 MED ORDER — DEXTROSE 5 % IV SOLN
INTRAVENOUS | Status: DC | PRN
  Administered 2020-09-18: 3 g via INTRAVENOUS

## 2020-09-18 MED ORDER — SODIUM CHLORIDE 0.9% FLUSH: 3.0000 mL | INTRAVENOUS | Status: DC | PRN

## 2020-09-18 MED ORDER — CEFAZOLIN SODIUM-DEXTROSE 2-4 GM/100ML-% IV SOLN
2.0000 g | Freq: Three times a day (TID) | INTRAVENOUS | Status: AC
  Administered 2020-09-18 – 2020-09-19 (×2): 2 g via INTRAVENOUS
  Filled 2020-09-18 (×2): qty 100

## 2020-09-18 MED ORDER — SODIUM CHLORIDE 0.9 % IV SOLN: INTRAVENOUS | Status: DC

## 2020-09-18 MED ORDER — ONDANSETRON HCL 4 MG/2ML IJ SOLN: 4.0000 mg | Freq: Four times a day (QID) | INTRAMUSCULAR | Status: DC | PRN

## 2020-09-18 MED ORDER — SENNOSIDES-DOCUSATE SODIUM 8.6-50 MG PO TABS: 1.0000 | ORAL_TABLET | Freq: Every evening | ORAL | Status: DC | PRN

## 2020-09-18 MED ORDER — HYDROCODONE-ACETAMINOPHEN 5-325 MG PO TABS
1.0000 | ORAL_TABLET | ORAL | Status: DC | PRN
  Administered 2020-09-18 – 2020-09-19 (×3): 1 via ORAL
  Filled 2020-09-18 (×2): qty 1

## 2020-09-18 MED ORDER — METHOCARBAMOL 1000 MG/10ML IJ SOLN: 500.0000 mg | Freq: Four times a day (QID) | INTRAVENOUS | Status: DC | PRN

## 2020-09-18 MED ORDER — MENTHOL 3 MG MT LOZG: 1.0000 | LOZENGE | OROMUCOSAL | Status: DC | PRN

## 2020-09-18 MED ORDER — ROCURONIUM BROMIDE 10 MG/ML (PF) SYRINGE
PREFILLED_SYRINGE | INTRAVENOUS | Status: DC | PRN
  Administered 2020-09-18 (×2): 10 mg via INTRAVENOUS
  Administered 2020-09-18: 20 mg via INTRAVENOUS
  Administered 2020-09-18: 50 mg via INTRAVENOUS

## 2020-09-18 MED ORDER — LIDOCAINE 2% (20 MG/ML) 5 ML SYRINGE
INTRAMUSCULAR | Status: DC | PRN
  Administered 2020-09-18: 100 mg via INTRAVENOUS

## 2020-09-18 MED ORDER — ALUM & MAG HYDROXIDE-SIMETH 200-200-20 MG/5ML PO SUSP: 30.0000 mL | Freq: Four times a day (QID) | ORAL | Status: DC | PRN

## 2020-09-18 MED ORDER — 0.9 % SODIUM CHLORIDE (POUR BTL) OPTIME
TOPICAL | Status: DC | PRN
  Administered 2020-09-18: 1000 mL

## 2020-09-18 MED ORDER — ROCURONIUM BROMIDE 10 MG/ML (PF) SYRINGE
PREFILLED_SYRINGE | INTRAVENOUS | Status: AC
  Filled 2020-09-18: qty 10

## 2020-09-18 MED ORDER — DEXAMETHASONE SODIUM PHOSPHATE 10 MG/ML IJ SOLN
INTRAMUSCULAR | Status: AC
  Filled 2020-09-18: qty 1

## 2020-09-18 MED ORDER — TRIAMCINOLONE ACETONIDE 40 MG/ML IJ SUSP
INTRAMUSCULAR | Status: AC
  Filled 2020-09-18: qty 5

## 2020-09-18 MED ORDER — THROMBIN 5000 UNITS EX SOLR
CUTANEOUS | Status: DC | PRN
  Administered 2020-09-18 (×2): 5000 [IU] via TOPICAL

## 2020-09-18 MED ORDER — MIDAZOLAM HCL 2 MG/2ML IJ SOLN
INTRAMUSCULAR | Status: AC
  Filled 2020-09-18: qty 2

## 2020-09-18 MED ORDER — HEMOSTATIC AGENTS (NO CHARGE) OPTIME
TOPICAL | Status: DC | PRN
  Administered 2020-09-18: 1 via TOPICAL

## 2020-09-18 MED ORDER — FENTANYL CITRATE (PF) 250 MCG/5ML IJ SOLN
INTRAMUSCULAR | Status: AC
  Filled 2020-09-18: qty 5

## 2020-09-18 MED ORDER — ACETAMINOPHEN 325 MG PO TABS: 650.0000 mg | ORAL_TABLET | ORAL | Status: DC | PRN

## 2020-09-18 MED ORDER — SUGAMMADEX SODIUM 200 MG/2ML IV SOLN
INTRAVENOUS | Status: DC | PRN
  Administered 2020-09-18: 300 mg via INTRAVENOUS

## 2020-09-18 MED ORDER — ONDANSETRON HCL 4 MG/2ML IJ SOLN
INTRAMUSCULAR | Status: AC
  Filled 2020-09-18: qty 2

## 2020-09-18 MED ORDER — HYDROCODONE-ACETAMINOPHEN 5-325 MG PO TABS
ORAL_TABLET | ORAL | Status: AC
  Filled 2020-09-18: qty 1

## 2020-09-18 MED ORDER — ORAL CARE MOUTH RINSE: 15.0000 mL | Freq: Once | OROMUCOSAL | Status: AC

## 2020-09-18 MED ORDER — MORPHINE SULFATE (PF) 2 MG/ML IV SOLN
2.0000 mg | INTRAVENOUS | Status: DC | PRN
  Administered 2020-09-18: 2 mg via INTRAVENOUS
  Filled 2020-09-18: qty 1

## 2020-09-18 MED ORDER — CHLORHEXIDINE GLUCONATE 0.12 % MT SOLN: 15.0000 mL | Freq: Once | OROMUCOSAL | Status: AC

## 2020-09-18 MED ORDER — SUCCINYLCHOLINE CHLORIDE 20 MG/ML IJ SOLN
INTRAMUSCULAR | Status: DC | PRN
  Administered 2020-09-18: 100 mg via INTRAVENOUS

## 2020-09-18 MED ORDER — PROPOFOL 500 MG/50ML IV EMUL
INTRAVENOUS | Status: DC | PRN
  Administered 2020-09-18: 50 ug/kg/min via INTRAVENOUS

## 2020-09-18 MED ORDER — LACTATED RINGERS IV SOLN: INTRAVENOUS | Status: DC

## 2020-09-18 MED ORDER — METHOCARBAMOL 500 MG PO TABS
500.0000 mg | ORAL_TABLET | Freq: Four times a day (QID) | ORAL | Status: DC | PRN
  Administered 2020-09-18 – 2020-09-19 (×3): 500 mg via ORAL
  Filled 2020-09-18 (×3): qty 1

## 2020-09-18 MED ORDER — PHENOL 1.4 % MT LIQD
1.0000 | OROMUCOSAL | Status: DC | PRN
  Administered 2020-09-19: 1 via OROMUCOSAL
  Filled 2020-09-18: qty 177

## 2020-09-18 MED ORDER — FENTANYL CITRATE (PF) 250 MCG/5ML IJ SOLN
INTRAMUSCULAR | Status: DC | PRN
  Administered 2020-09-18 (×2): 50 ug via INTRAVENOUS
  Administered 2020-09-18: 100 ug via INTRAVENOUS
  Administered 2020-09-18: 50 ug via INTRAVENOUS

## 2020-09-18 SURGICAL SUPPLY — 74 items
BAND RUBBER #18 3X1/16 STRL (MISCELLANEOUS) ×6 IMPLANT
BENZOIN TINCTURE PRP APPL 2/3 (GAUZE/BANDAGES/DRESSINGS) IMPLANT
BIT DRILL NEURO 2X3.1 SFT TUCH (MISCELLANEOUS) ×1 IMPLANT
BLADE CLIPPER SURG (BLADE) IMPLANT
BUR CARBIDE MATCH 3.0 (BURR) ×3 IMPLANT
CANISTER SUCT 3000ML PPV (MISCELLANEOUS) ×3 IMPLANT
CARTRIDGE OIL MAESTRO DRILL (MISCELLANEOUS) ×1 IMPLANT
CLOSURE WOUND 1/2 X4 (GAUZE/BANDAGES/DRESSINGS) ×1
COVER MAYO STAND STRL (DRAPES) ×6 IMPLANT
COVER WAND RF STERILE (DRAPES) ×3 IMPLANT
DIFFUSER DRILL AIR PNEUMATIC (MISCELLANEOUS) ×3 IMPLANT
DRAPE C-ARM 42X72 X-RAY (DRAPES) ×3 IMPLANT
DRAPE HALF SHEET 40X57 (DRAPES) IMPLANT
DRAPE LAPAROTOMY 100X72 PEDS (DRAPES) ×3 IMPLANT
DRAPE MICROSCOPE LEICA (MISCELLANEOUS) ×3 IMPLANT
DRILL NEURO 2X3.1 SOFT TOUCH (MISCELLANEOUS) ×3
DRSG OPSITE POSTOP 4X6 (GAUZE/BANDAGES/DRESSINGS) ×3 IMPLANT
DURAPREP 6ML APPLICATOR 50/CS (WOUND CARE) ×3 IMPLANT
DURASEAL APPLICATOR TIP (TIP) ×3 IMPLANT
DURASEAL SPINE SEALANT 3ML (MISCELLANEOUS) ×3 IMPLANT
ELECT COATED BLADE 2.86 ST (ELECTRODE) ×3 IMPLANT
ELECT REM PT RETURN 9FT ADLT (ELECTROSURGICAL) ×3
ELECTRODE REM PT RTRN 9FT ADLT (ELECTROSURGICAL) ×1 IMPLANT
EVACUATOR 1/8 PVC DRAIN (DRAIN) ×3 IMPLANT
FEE INTRAOP MONITOR IMPULS NCS (MISCELLANEOUS) ×1 IMPLANT
GAUZE 4X4 16PLY RFD (DISPOSABLE) IMPLANT
GLOVE BIO SURGEON STRL SZ 6.5 (GLOVE) ×6 IMPLANT
GLOVE BIO SURGEON STRL SZ7.5 (GLOVE) ×3 IMPLANT
GLOVE BIO SURGEONS STRL SZ 6.5 (GLOVE) ×3
GLOVE BIOGEL PI IND STRL 7.5 (GLOVE) ×1 IMPLANT
GLOVE BIOGEL PI IND STRL 8 (GLOVE) ×1 IMPLANT
GLOVE BIOGEL PI INDICATOR 7.5 (GLOVE) ×2
GLOVE BIOGEL PI INDICATOR 8 (GLOVE) ×2
GLOVE ECLIPSE 8.0 STRL XLNG CF (GLOVE) ×6 IMPLANT
GLOVE EXAM NITRILE LRG STRL (GLOVE) IMPLANT
GLOVE EXAM NITRILE XL STR (GLOVE) IMPLANT
GLOVE EXAM NITRILE XS STR PU (GLOVE) IMPLANT
GLOVE SURG SS PI 6.5 STRL IVOR (GLOVE) ×3 IMPLANT
GOWN STRL REUS W/ TWL LRG LVL3 (GOWN DISPOSABLE) ×1 IMPLANT
GOWN STRL REUS W/ TWL XL LVL3 (GOWN DISPOSABLE) ×1 IMPLANT
GOWN STRL REUS W/TWL 2XL LVL3 (GOWN DISPOSABLE) IMPLANT
GOWN STRL REUS W/TWL LRG LVL3 (GOWN DISPOSABLE) ×2
GOWN STRL REUS W/TWL XL LVL3 (GOWN DISPOSABLE) ×2
GRAFT DURAGEN MATRIX 1WX1L (Tissue) ×3 IMPLANT
HEMOSTAT POWDER KIT SURGIFOAM (HEMOSTASIS) ×3 IMPLANT
INTRAOP MONITOR FEE IMPULS NCS (MISCELLANEOUS) ×1
INTRAOP MONITOR FEE IMPULSE (MISCELLANEOUS) ×2
KIT BASIN OR (CUSTOM PROCEDURE TRAY) ×3 IMPLANT
KIT TURNOVER KIT B (KITS) ×3 IMPLANT
NEEDLE HYPO 18GX1.5 BLUNT FILL (NEEDLE) ×3 IMPLANT
NEEDLE HYPO 22GX1.5 SAFETY (NEEDLE) ×3 IMPLANT
NEEDLE SPNL 18GX3.5 QUINCKE PK (NEEDLE) ×3 IMPLANT
NS IRRIG 1000ML POUR BTL (IV SOLUTION) ×3 IMPLANT
OIL CARTRIDGE MAESTRO DRILL (MISCELLANEOUS) ×3
PACK LAMINECTOMY NEURO (CUSTOM PROCEDURE TRAY) ×3 IMPLANT
PAD ARMBOARD 7.5X6 YLW CONV (MISCELLANEOUS) IMPLANT
PIN DISTRACTION 14MM (PIN) ×6 IMPLANT
PLATE CERV CONS OZARK 2X44 (Plate) ×3 IMPLANT
PUTTY DBX 1CC (Putty) ×3 IMPLANT
PUTTY DBX 1CC DEPUY (Putty) ×1 IMPLANT
SCREW FIXED ST OZARK 4X16 (Screw) ×6 IMPLANT
SCREW VA ST OZARK 4X16 (Plate) ×12 IMPLANT
SPACER ANGLD CASCAD 16X13X8 7D (Spacer) ×6 IMPLANT
SPONGE INTESTINAL PEANUT (DISPOSABLE) ×3 IMPLANT
SPONGE SURGIFOAM ABS GEL SZ50 (HEMOSTASIS) ×3 IMPLANT
STAPLER VISISTAT 35W (STAPLE) ×3 IMPLANT
STRIP CLOSURE SKIN 1/2X4 (GAUZE/BANDAGES/DRESSINGS) ×2 IMPLANT
SUT SILK 2 0 TIES 10X30 (SUTURE) ×3 IMPLANT
SYR 5ML LL (SYRINGE) ×3 IMPLANT
TAPE SURG TRANSPORE 1 IN (GAUZE/BANDAGES/DRESSINGS) ×1 IMPLANT
TAPE SURGICAL TRANSPORE 1 IN (GAUZE/BANDAGES/DRESSINGS) ×2
TOWEL GREEN STERILE (TOWEL DISPOSABLE) ×3 IMPLANT
TOWEL GREEN STERILE FF (TOWEL DISPOSABLE) ×3 IMPLANT
WATER STERILE IRR 1000ML POUR (IV SOLUTION) ×3 IMPLANT

## 2020-09-18 NOTE — Transfer of Care (Signed)
Immediate Anesthesia Transfer of Care Note  Patient: Riley Ross  Procedure(s) Performed: Anterior Cervical Decompression Fusion Cervical four- Cervical five, Cervical five-Cervical six (N/A )  Patient Location: PACU  Anesthesia Type:General  Level of Consciousness: awake, alert , oriented and drowsy  Airway & Oxygen Therapy: Patient Spontanous Breathing and Patient connected to face mask oxygen  Post-op Assessment: Report given to RN, Post -op Vital signs reviewed and stable and Patient moving all extremities X 4  Post vital signs: Reviewed and stable  Last Vitals:  Vitals Value Taken Time  BP 150/92 09/18/20 1216  Temp    Pulse 94 09/18/20 1215  Resp 17 09/18/20 1217  SpO2 100 % 09/18/20 1215  Vitals shown include unvalidated device data.  Last Pain:  Vitals:   09/18/20 0623  TempSrc: Oral  PainSc:       Patients Stated Pain Goal: 3 (09/18/20 9983)  Complications: No complications documented.

## 2020-09-18 NOTE — Anesthesia Procedure Notes (Signed)
Procedure Name: Intubation Date/Time: 09/18/2020 7:50 AM Performed by: Lanell Matar, CRNA Pre-anesthesia Checklist: Patient identified, Emergency Drugs available, Suction available and Patient being monitored Patient Re-evaluated:Patient Re-evaluated prior to induction Oxygen Delivery Method: Circle System Utilized Preoxygenation: Pre-oxygenation with 100% oxygen Induction Type: IV induction Laryngoscope Size: Glidescope and 4 Grade View: Grade I Tube type: Oral Tube size: 7.5 mm Number of attempts: 1 Airway Equipment and Method: Stylet,  Oral airway and Video-laryngoscopy Placement Confirmation: ETT inserted through vocal cords under direct vision,  positive ETCO2 and breath sounds checked- equal and bilateral Secured at: 23 cm Tube secured with: Tape Dental Injury: Teeth and Oropharynx as per pre-operative assessment  Comments: Glidescope used at surgeon's request. No neck extension during induction/intubation. Grade 1 view. AOI

## 2020-09-18 NOTE — Progress Notes (Signed)
   Providing Compassionate, Quality Care - Together  NEUROSURGERY PROGRESS NOTE   S: s/e in pacu. States his LUE numbness/tingling is better  O: EXAM:  BP (!) 150/92 (BP Location: Left Arm)   Pulse 94   Temp 98.5 F (36.9 C)   Resp 10   Ht 5\' 10"  (1.778 m)   Wt 116.7 kg   SpO2 100%   BMI 36.92 kg/m   Awake, alert Speech fluent, appropriate  Cns intact BL 5/5 RUE/BLE  LUE delt, bi 5/5, tri/grip 4/5 Incision c/d/i Neck soft Trachea midline nml phonation  ASSESSMENT:  34 y.o. male with   1.  Cervical myelopathy due to cervical spondylosis C4-C6 with cord signal change  -s/p ACDF C4-C6 on 09/18/2020  PLAN: -floor -HOB 30 deg -mon. hmv -collar -pt/ot -dvt ppx -pain control    Thank you for allowing me to participate in this patient's care.  Please do not hesitate to call with questions or concerns.   13/07/2020, DO Neurosurgeon [facility] Cell: [phone]

## 2020-09-18 NOTE — Anesthesia Preprocedure Evaluation (Signed)
Anesthesia Evaluation  Patient identified by MRN, date of birth, ID band Patient awake    Reviewed: Allergy & Precautions, NPO status , Patient's Chart, lab work & pertinent test results  History of Anesthesia Complications (+) Family history of anesthesia reaction  Airway Mallampati: II  TM Distance: >3 FB     Dental   Pulmonary Current Smoker and Patient abstained from smoking.,    breath sounds clear to auscultation       Cardiovascular negative cardio ROS   Rhythm:Regular Rate:Normal     Neuro/Psych    GI/Hepatic negative GI ROS, Neg liver ROS,   Endo/Other  negative endocrine ROS  Renal/GU negative Renal ROS     Musculoskeletal negative musculoskeletal ROS (+)   Abdominal   Peds  Hematology   Anesthesia Other Findings   Reproductive/Obstetrics                             Anesthesia Physical Anesthesia Plan  ASA: III  Anesthesia Plan: General   Post-op Pain Management:    Induction: Intravenous  PONV Risk Score and Plan: 2 and Ondansetron, Dexamethasone and Midazolam  Airway Management Planned: Oral ETT  Additional Equipment:   Intra-op Plan:   Post-operative Plan: Extubation in OR  Informed Consent: I have reviewed the patients History and Physical, chart, labs and discussed the procedure including the risks, benefits and alternatives for the proposed anesthesia with the patient or authorized representative who has indicated his/her understanding and acceptance.     Dental advisory given  Plan Discussed with: CRNA and Anesthesiologist  Anesthesia Plan Comments:         Anesthesia Quick Evaluation

## 2020-09-18 NOTE — Evaluation (Signed)
Physical Therapy Evaluation Patient Details Name: Riley Ross MRN: [id number] DOB: [birth year] Today's Date: 09/18/2020   History of Present Illness  Pt is a 34 y.o. M with no significant PMH who presents with DDD and cervical spine cord compression now s/p C4-6 ACDF.  Clinical Impression  Patient evaluated by Physical Therapy with no further acute PT needs identified. Prior to admission, pt lives alone in a second floor apartment and works as a Firefighter at [employer]. Pt presents with good pain control and reports improvement in LUE radicular symptoms and fine motor coordination. Pt ambulating hallway distances with no assistive device without physical difficulty. Negotiated 10 steps with bilateral railings to simulate apartment entrance. Education provided regarding cervical precautions, generalized walking program, car transfer technique. All education has been completed and the patient has no further questions. No follow-up Physical Therapy or equipment needs. PT is signing off. Thank you for this referral.     Follow Up Recommendations No PT follow up;Supervision - Intermittent    Equipment Recommendations  None recommended by PT    Recommendations for Other Services       Precautions / Restrictions Precautions Precautions: Cervical Precaution Booklet Issued: Yes (comment) Precaution Comments: verbally reviewed, provided written handout Required Braces or Orthoses: Cervical Brace Cervical Brace: Hard collar;At all times Restrictions Weight Bearing Restrictions: No      Mobility  Bed Mobility Overal bed mobility: Modified Independent             General bed mobility comments: cues for log roll technique, HOB flat, use of rail    Transfers Overall transfer level: Independent Equipment used: None                Ambulation/Gait Ambulation/Gait assistance: Independent Gait Distance (Feet): 400 Feet Assistive device: None Gait Pattern/deviations: WFL(Within  Functional Limits)     General Gait Details: No gait deviations or gross imbalance  Stairs Stairs: Yes Stairs assistance: Supervision Stair Management: Two rails Number of Stairs: 10 General stair comments: Cues for technique, step by step pattern  Wheelchair Mobility    Modified Rankin (Stroke Patients Only)       Balance Overall balance assessment: No apparent balance deficits (not formally assessed)                                           Pertinent Vitals/Pain Pain Assessment: Faces Faces Pain Scale: Hurts a little bit Pain Location: surgical site Pain Descriptors / Indicators: Operative site guarding Pain Intervention(s): Monitored during session    Home Living Family/patient expects to be discharged to:: Private residence Living Arrangements: Alone Available Help at Discharge: Family;Available PRN/intermittently Type of Home: Apartment Home Access: Stairs to enter Entrance Stairs-Rails: Doctor, general practice of Steps:  (flight) Home Layout: One level Home Equipment: None Additional Comments: Pt father and cousin available to help initially    Prior Function Level of Independence: Independent         Comments: Firefighter at [employer]        Extremity/Trunk Assessment   Upper Extremity Assessment Upper Extremity Assessment: Defer to OT evaluation    Lower Extremity Assessment Lower Extremity Assessment: RLE deficits/detail;LLE deficits/detail RLE Deficits / Details: Strength 5/5 LLE Deficits / Details: Strength 5/5    Cervical / Trunk Assessment Cervical / Trunk Assessment: Other exceptions Cervical / Trunk Exceptions: s/p ACDF  Communication  Communication: No difficulties  Cognition Arousal/Alertness: Awake/alert Behavior During Therapy: WFL for tasks assessed/performed Overall Cognitive Status: Within Functional Limits for tasks assessed                                         General Comments      Exercises     Assessment/Plan    PT Assessment Patent does not need any further PT services  PT Problem List         PT Treatment Interventions      PT Goals (Current goals can be found in the Care Plan section)  Acute Rehab PT Goals Patient Stated Goal: return to work by January PT Goal Formulation: All assessment and education complete, DC therapy    Frequency     Barriers to discharge        Co-evaluation               AM-PAC PT "6 Clicks" Mobility  Outcome Measure Help needed turning from your back to your side while in a flat bed without using bedrails?: None Help needed moving from lying on your back to sitting on the side of a flat bed without using bedrails?: None Help needed moving to and from a bed to a chair (including a wheelchair)?: None Help needed standing up from a chair using your arms (e.g., wheelchair or bedside chair)?: None Help needed to walk in hospital room?: None Help needed climbing 3-5 steps with a railing? : None 6 Click Score: 24    End of Session Equipment Utilized During Treatment: Cervical collar Activity Tolerance: Patient tolerated treatment well Patient left: with call bell/phone within reach;Other (comment) (bathroom) Nurse Communication: Mobility status PT Visit Diagnosis: Pain Pain - part of body:  (cervical)    Time: 7915-0569 PT Time Calculation (min) (ACUTE ONLY): 16 min   Charges:   PT Evaluation $PT Eval Low Complexity: 1 Low          Lillia Pauls, PT, DPT Acute Rehabilitation Services Pager [phone] Office [phone]   Norval Morton 09/18/2020, 5:32 PM

## 2020-09-18 NOTE — H&P (Signed)
    Providing Compassionate, Quality Care - Together  NEUROSURGERY HISTORY & PHYSICAL   Riley Ross is an 34 y.o. male.   Chief Complaint: LUE weakness and numbness HPI: This is a 34 year old male that of been complaining of neck pain and left upper extremity tingling, pins-and-needles feeling for approximately 3 months.  He was seen by neurologist and MRI of the cervical spine was obtained which showed degenerative disc disease and cervical spine cord compression C4-C6 with cord signal change.  He also complained of dropping objects with his left hand due to his weakness.  He also complained of left grip weakness.  He denied any changes in his handwriting.  His neck pain has been progressing over the past 3 months and his numbness tingling has been progressing over the past 3 weeks.    Past Medical History:  Diagnosis Date  . Complication of anesthesia   . Family history of adverse reaction to anesthesia   . Medical history non-contributory     Past Surgical History:  Procedure Laterality Date  . KNEE SURGERY Left    knee cap  . TONSILLECTOMY    . TYMPANOSTOMY TUBE PLACEMENT      Family History  Problem Relation Age of Onset  . Kidney disease Mother   . Carpal tunnel syndrome Mother   . Seizures Brother   . Lung disease Maternal Grandmother    Social History:  reports that he has been smoking cigars. He has never used smokeless tobacco. He reports current drug use. Frequency: 7.00 times per week. Drug: Marijuana. He reports that he does not drink alcohol.  Allergies: No Known Allergies  Medications Prior to Admission  Medication Sig Dispense Refill  . gabapentin (NEURONTIN) 300 MG capsule Take 1 capsule (300 mg total) by mouth at bedtime. 60 capsule 1  . tiZANidine (ZANAFLEX) 4 MG tablet Take 1 tablet (4 mg total) by mouth 2 (two) times daily as needed for muscle spasms. (Patient not taking: Reported on 09/11/2020) 30 tablet 0    No results found for this or any previous  visit (from the past 48 hour(s)). No results found.  ROS 14 point review of systems was obtained which all pertinent positives and negatives are listed in HPI above  Blood pressure (!) 144/89, pulse 76, temperature 98.3 F (36.8 C), temperature source Oral, resp. rate 18, height 5\' 10"  (1.778 m), weight 116.7 kg, SpO2 100 %. Physical Exam  A&O x3 PERRLA Face symmetric Right upper extremity 5/5 Left upper extremity 4/5 in grip and tricep, 5/5 bicep, deltoid Bilateral lower extremity 5/5 strength Left C6 decreased sensation Positive Hoffmann's bilaterally Left upper extremity DTR 3/4  Assessment/Plan 34 year old male with  1.  Cervical myelopathy due to cervical spondylosis C4-C6 with cord signal change  -OR today for ACDF C4-C6 -N.p.o. -All risks and benefits of surgery versus observation were discussed and agreed upon.  Due to the degree of cervical spinal cord compression and cord signal change I recommended surgical decompression over conservative measurements in efforts to maintain and preserve neurologic function.  I discussed this with the patient at the bedside and he understands and agrees. -Imaging reviewed and confirmed with the patient    Thank you for allowing me to participate in this patient's care.  Please do not hesitate to call with questions or concerns.   20, DO Neurosurgeon [facility] Cell: [phone]

## 2020-09-18 NOTE — Op Note (Signed)
PREOP DIAGNOSIS: Cervical spondylosis with myelopathy and radiculopathy; left upper extremity C6 radiculopathy; cervical myelopathy with cord compression and cord signal change C4-C6  POSTOP DIAGNOSIS: Same  PROCEDURE: 1. Arthrodesis C4, C5, C6, anterior interbody technique  2. Placement of intervertebral biomechanical device C4-C5, C5-C6; K2M [place] titanium interbody 3. Placement of anterior instrumentation consisting of interbody plate and screws -C4, C5, C6; K2M [place] plate 4. Discectomy at C4-C5, C5-C6 for decompression of spinal cord and exiting nerve roots  5. Use of morselized bone allograft  6. Use of intraoperative microscope 7.  Intraoperative use of neuro monitoring, SSEPs, greater than 1 h  SURGEON: Dr. Kendell Bane Koltan Portocarrero, DO  ASSISTANT: None  ANESTHESIA: General Endotracheal  EBL: 50 cc  SPECIMENS: None  DRAINS: Medium hmv  COMPLICATIONS: None immediate  CONDITION: Hemodynamically stable to PACU  HISTORY: Axiel Fjeld is a 34 y.o. y.o. male who initially presented to the outpatient clinic with signs and symptoms consistent with cervical myelopathy and left upper extremity C6 radiculopathy. He had numbness and tingling in the C6 dermatome on the left as well as with dropping objects due to his left upper extremity weakness and he was hyperreflexic throughout.  MRI demonstrated severe stenosis with central disc herniation at C4-5 and cord compression, C5-6 cord compression that was moderate with cord signal change and bilateral neuroforaminal severe stenosis, left greater than right. Treatment options were discussed including observation versus therapy versus injections versus surgery.  I recommended surgical decompression as he had signs and symptoms of myelopathy that was progressing over the past month. After all questions were answered, informed consent was obtained.  Risks and benefits of the surgery including but not limited to heart attack, stroke, need for more surgery,  permanent or temporary neurologic damage, CSF leak, esophageal injury, vocal cord injury were all discussed and agreed upon.  PROCEDURE IN DETAIL: The patient was brought to the operating room and transferred to the operative table. After induction of general anesthesia, the patient was positioned on the operative table in the supine position with all pressure points meticulously padded.  Prepositioning SSEPs were obtained.  The patient's neck was then placed in slight extension.  Post positional SSEPs were noted to be stable.  The skin of the neck was then prepped and draped in the usual sterile fashion.  After timeout was conducted. Skin incision was then made sharply and Bovie electrocautery was used to dissect the subcutaneous tissue until the platysma was identified. The platysma was then divided and undermined. The sternocleidomastoid muscle was then identified and, utilizing natural fascial planes in the neck, the prevertebral fascia was identified and the carotid sheath was retracted laterally and the trachea and esophagus retracted medially. Again using fluoroscopy, the correct disc space was identified to be C5-6. Bovie electrocautery was used to dissect in the subperiosteal plane and elevate the bilateral longus coli muscles along C4, C5 and C6 bilaterally. Self-retaining retractors were then placed under the longus coli muscles at C4-C5. At this point, the microscope was draped and brought into the field, and the remainder of the case was done under the microscope using microdissecting technique.  Distraction pins were placed midline in the C4 and C5 vertebral bodies.  The disc base was replaced and distraction.  The C4-5 disc space was incised sharply and rongeurs were used to initially complete a discectomy. The high-speed drill was then used to complete discectomy until the posterior annulus was identified and removed and the posterior longitudinal ligament was identified. Using a micro  curettes,  the PLL was elevated at the lateral recess, and Kerrison rongeurs were used to remove the posterior longitudinal ligament bilaterally and the ventral thecal sac was identified.  There was a very large central disc herniation noted as well as a large inferior osteophyte along the superior endplate of C5.  Using a combination of curettes and ronguers, complete decompression of the thecal sac and exiting nerve roots at this level was performed, and verified using micro-nerve hook.  There was significant adherence of the PLL midline inferiorly along the C5 superior endplate where on preoperative CT there is a large calcified osteophyte.  Using microcurette's this was removed and was adherent to the dura and there was noted to be a small tear in the thecal sac.  The arachnoid appeared intact and a very slight amount of clear CSF was noted.  The thecal sac was pulsatile and appeared extensively decompressed bilaterally.  A very small piece of DuraGen was placed over the durotomy and sealed with dural glue.  The disc space was taken out of distraction.  I monitored the disc space for a period of 5 minutes and noted there was no egress of CSF.  The disc space was hemostatic.  Having completed the decompression, attention was turned to placement of the intervertebral device. Trial spacers were used to select a 8 mm titanium lordotic graft. This graft was then filled with morcellized allograft, and inserted under live fluoroscopy.  The distraction pin was removed from C4 and hemostasis was obtained with Surgi-Flo.  Distraction pin was placed midline in the C6 vertebral body.The retractors were moved down to the C5 -6 disc space and placed under the longus coli bilaterally.  The C5-6 disc space was incised sharply and placed under distraction.  Using curettes and rongeurs a discectomy was performed laterally to the bilateral uncovertebral joints. The high-speed drill was then used to complete discectomy until the  posterior annulus was identified and removed and the posterior longitudinal ligament was identified. Using micro curettes, the PLL was elevated in the lateral recess, and Kerrison rongeurs were used to remove the posterior longitudinal ligament bilaterally and the ventral thecal sac was identified.  There was a large broad-based disc herniation noted as well as osteophytes along the inferior endplate of C5 and superior endplate of C6.  Using a combination of curettes and ronguers, complete decompression of the thecal sac and exiting nerve roots at this level was completed and resection of the osteophytes was performed, and verified using micro-nerve hook. The thecal sac was pulsatile and appeared extensively decompressed bilaterally. The disc space was taken out of distraction.  The disc space was noted to be hemostatic.  Having completed the decompression, attention was turned to placement of the intervertebral device. Trial spacers were used to select a 8 mm titanium lordotic graft. This graft was then filled with morcellized allograft, and inserted under live fluoroscopy.  After placement of the intervertebral devices, the above anterior cervical plate was selected, and placed across the interspaces C4-5, C5-6. Using a high-speed drill, the cortex of the cervical vertebral bodies (C4, C5, C6 bilaterally) was punctured, and screws inserted in the level above and below. Final fluoroscopic images in AP and lateral projections were taken to confirm good hardware placement.  At this point, after all counts were verified to be correct, meticulous hemostasis was secured using a combination of bipolar electrocautery and passive hemostatics.  Upon removing retractors I was concerned that the esophagus appeared irritated in one small section therefore had anesthesia  place an NG tube.  Methylene blue dye was injected down the NG tube as it was progressively retracted as I monitored the wound.  I noted no egress of  methylene blue whatsoever.  This was done multiple times as the NG tube was removed.  Again the wound was explored and no blood methylene blue was noted whatsoever.  The wound was copiously irrigated and noted to be hemostatic.  A Hemovac drain was tunneled laterally and placed in the prevertebral space.  The skin was then closed with staples and a sterile dressing was applied.  Neuro monitoring identified a slight improvement in preoperative SSEPs and stable SSEPs throughout the entire case.  The patient tolerated the procedure well and was extubated in the room and taken to the postanesthesia care unit in stable condition.

## 2020-09-18 NOTE — Anesthesia Postprocedure Evaluation (Signed)
Anesthesia Post Note  Patient: Riley Ross  Procedure(s) Performed: Anterior Cervical Decompression Fusion Cervical four- Cervical five, Cervical five-Cervical six (N/A )     Patient location during evaluation: PACU Anesthesia Type: General Level of consciousness: awake Pain management: pain level controlled Vital Signs Assessment: post-procedure vital signs reviewed and stable Respiratory status: spontaneous breathing Cardiovascular status: stable Postop Assessment: no apparent nausea or vomiting Anesthetic complications: no   No complications documented.  Last Vitals:  Vitals:   09/18/20 1317 09/18/20 1340  BP: (!) 141/83 (!) 152/96  Pulse: 71 80  Resp: 15   Temp: 36.8 C 36.8 C  SpO2: 99% 100%    Last Pain:  Vitals:   09/18/20 1340  TempSrc: Oral  PainSc:                  Kirkland Figg

## 2020-09-18 NOTE — Progress Notes (Signed)
Orthopedic Tech Progress Note Patient Details:  Riley Ross 1986-08-28 [id number] Patient has collar Patient ID: [name], male   DOB: Sep 18, 1986, 34 y.o.   MRN: [id number]   [name] 09/18/2020, 1:59 PM

## 2020-09-19 ENCOUNTER — Encounter (HOSPITAL_COMMUNITY): Payer: Self-pay | Admitting: Neurological Surgery

## 2020-09-19 ENCOUNTER — Other Ambulatory Visit (HOSPITAL_COMMUNITY): Payer: Self-pay | Admitting: Neurological Surgery

## 2020-09-19 DIAGNOSIS — M4722 Other spondylosis with radiculopathy, cervical region: Secondary | ICD-10-CM | POA: Diagnosis not present

## 2020-09-19 LAB — BASIC METABOLIC PANEL
Anion gap: 12 (ref 5–15)
BUN: 10 mg/dL (ref 6–20)
CO2: 25 mmol/L (ref 22–32)
Calcium: 9.4 mg/dL (ref 8.9–10.3)
Chloride: 102 mmol/L (ref 98–111)
Creatinine, Ser: 1.33 mg/dL — ABNORMAL HIGH (ref 0.61–1.24)
GFR, Estimated: >60 mL/min (ref >60)
Glucose, Bld: 101 mg/dL — ABNORMAL HIGH (ref 70–99)
Potassium: 4.1 mmol/L (ref 3.5–5.1)
Sodium: 139 mmol/L (ref 135–145)

## 2020-09-19 LAB — CBC
HCT: 43.3 % (ref 39.0–52.0)
Hemoglobin: 14 g/dL (ref 13.0–17.0)
MCH: 29.2 pg (ref 26.0–34.0)
MCHC: 32.3 g/dL (ref 30.0–36.0)
MCV: 90.4 fL (ref 80.0–100.0)
Platelets: 252 10*3/uL (ref 150–400)
RBC: 4.79 MIL/uL (ref 4.22–5.81)
RDW: 12.4 % (ref 11.5–15.5)
WBC: 9.4 10*3/uL (ref 4.0–10.5)
nRBC: 0 % (ref 0.0–0.2)

## 2020-09-19 MED ORDER — METHOCARBAMOL 500 MG PO TABS: 500.0000 mg | ORAL_TABLET | Freq: Three times a day (TID) | ORAL | 1 refills | Status: DC | PRN

## 2020-09-19 MED ORDER — HYDROCODONE-ACETAMINOPHEN 5-325 MG PO TABS: 1.0000 | ORAL_TABLET | ORAL | 0 refills | Status: DC | PRN

## 2020-09-19 MED FILL — METHOCARBAMOL 500 MG TABS: 500 | 30 days supply | Qty: 90 | Fill #0

## 2020-09-19 MED FILL — HYDROCODON-APAP 5-325: 5-325 | 5 days supply | Qty: 20 | Fill #0

## 2020-09-19 NOTE — Plan of Care (Signed)
Pt doing well. Pt and father given D/C instructions with verbal understanding. Rx's were delivered to the Pt's room by [facility] pharmacy. Pt's incision is clean and dry with no sign of infection. Pt's IV and Hemovac were removed prior to D/C. Pt D/C'd home via wheelchair per MD order. Pt is stable @ D/C and has no other needs at this time. Rema Fendt, RN

## 2020-09-19 NOTE — Evaluation (Signed)
Occupational Therapy Evaluation Patient Details Name: Riley Ross MRN: [id number] DOB: 1986-03-05 Today's Date: 09/19/2020    History of Present Illness Pt is a 34 y.o. M with no significant PMH who presents with DDD and cervical spine cord compression now s/p C4-6 ACDF.   Clinical Impression   Patient evaluated by Occupational Therapy with no further acute OT needs identified. All education has been completed and the patient has no further questions. Pt demonstrates good understanding of cervical precautions.  He is able to perform ADLs with Supervision to mod I.  Reviewed use and acquisition of reacher.  See below for any follow-up Occupational Therapy or equipment needs. OT is signing off. Thank you for this referral.        Follow Up Recommendations  No OT follow up;Supervision - Intermittent    Equipment Recommendations  3 in 1 bedside commode    Recommendations for Other Services       Precautions / Restrictions Precautions Precautions: Cervical Precaution Booklet Issued: Yes (comment) Precaution Comments: Pt able to verbalized cervical precautions independently and demonstrates good understanding  Required Braces or Orthoses: Cervical Brace Cervical Brace: Hard collar;At all times Restrictions Weight Bearing Restrictions: No      Mobility Bed Mobility Overal bed mobility: Modified Independent                  Transfers Overall transfer level: Independent                    Balance Overall balance assessment: No apparent balance deficits (not formally assessed)                                         ADL either performed or assessed with clinical judgement   ADL Overall ADL's : Needs assistance/impaired Eating/Feeding: Independent   Grooming: Wash/dry hands;Wash/dry face;Oral care;Brushing hair;Supervision/safety;Standing Grooming Details (indicate cue type and reason): reviewed safe technique for oral care and shaving   Upper Body Bathing: Supervision/ safety;Set up;Sitting   Lower Body Bathing: Supervison/ safety;Set up;Sit to/from stand Lower Body Bathing Details (indicate cue type and reason): able to perform figure 4  Upper Body Dressing : Supervision/safety;Sitting   Lower Body Dressing: Supervision/safety;Sit to/from stand Lower Body Dressing Details (indicate cue type and reason): able to perform figure 4  Toilet Transfer: Supervision/safety;Ambulation;Comfort height toilet   Toileting- Clothing Manipulation and Hygiene: Supervision/safety;Sit to/from stand   Tub/ Engineer, structural: Tub transfer   Functional mobility during ADLs: Supervision/safety       Vision         Perception     Praxis      Pertinent Vitals/Pain Pain Assessment: 0-10 Pain Score: 3  Pain Location: surgical site Pain Descriptors / Indicators: Operative site guarding Pain Intervention(s): Monitored during session     Hand Dominance Right   Extremity/Trunk Assessment Upper Extremity Assessment Upper Extremity Assessment: LUE deficits/detail LUE Deficits / Details: mildly impaired sensation Lt fingertips.  He is able to independently manipulate objects in hand without difficulty    Lower Extremity Assessment Lower Extremity Assessment: Defer to PT evaluation   Cervical / Trunk Assessment Cervical / Trunk Assessment: Other exceptions Cervical / Trunk Exceptions: s/p ACDF   Communication Communication Communication: No difficulties   Cognition Arousal/Alertness: Awake/alert Behavior During Therapy: WFL for tasks assessed/performed Overall Cognitive Status: Within Functional Limits for tasks assessed  General Comments  reviewed how to don/doff cervical collar with pt and he was able to return demonstration.  All instruction completed.     Exercises     Shoulder Instructions      Home Living Family/patient expects to be discharged to:: Private  residence Living Arrangements: Alone Available Help at Discharge: Family;Available PRN/intermittently Type of Home: Apartment Home Access: Stairs to enter Entrance Stairs-Number of Steps: full flight  Entrance Stairs-Rails: Right;Left Home Layout: One level     Bathroom Shower/Tub: Chief Strategy Officer: Standard     Home Equipment: None   Additional Comments: Pt father and cousin available to help initially      Prior Functioning/Environment Level of Independence: Independent        Comments: Firefighter at [employer]        OT Problem List: Pain      OT Treatment/Interventions:      OT Goals(Current goals can be found in the care plan section) Acute Rehab OT Goals Patient Stated Goal: to get back to normal  OT Goal Formulation: All assessment and education complete, DC therapy  OT Frequency:     Barriers to D/C:            Co-evaluation              AM-PAC OT "6 Clicks" Daily Activity     Outcome Measure Help from another person eating meals?: Total Help from another person taking care of personal grooming?: A Little Help from another person toileting, which includes using toliet, bedpan, or urinal?: A Little Help from another person bathing (including washing, rinsing, drying)?: A Little Help from another person to put on and taking off regular upper body clothing?: A Little Help from another person to put on and taking off regular lower body clothing?: A Little 6 Click Score: 16   End of Session Equipment Utilized During Treatment: Cervical collar Nurse Communication: Mobility status  Activity Tolerance: Patient tolerated treatment well Patient left: in bed;with call bell/phone within reach  OT Visit Diagnosis: Pain Pain - part of body:  (neck )                Time: 9629-5284 OT Time Calculation (min): 15 min Charges:  OT General Charges $OT Visit: 1 Visit  Eber Jones., OTR/L Acute Rehabilitation Services Pager [phone] Office  [phone]   Jeani Hawking M 09/19/2020, 10:32 AM

## 2020-09-19 NOTE — Discharge Summary (Signed)
°  Physician Discharge Summary  Patient ID: Riley Ross MRN: [id number] DOB/AGE: [birth year] 34 y.o.  Admit date: 09/18/2020 Discharge date: 09/19/2020  Admission Diagnoses:  1.  Cervical myelopathy, left upper extremity C6 radiculopathy 2.  Cervical spondylosis with cord compression and cord signal change  Discharge Diagnoses:  Same Active Problems:   Cervical myelopathy [facility])   Discharged Condition: Stable  Hospital Course:  Erron Ross is a 34 y.o. male who presented to my outpatient office with complaints of cervical myelopathy and radiculopathy.  Imaging revealed severe cord compression at C4-5 and moderate cord compression at C5-6 with cord signal change.  We discussed surgical options and ultimately he underwent an ACDF C4-C6 on 09/18/2020.  He tolerated the surgery well.  He was monitored postoperatively in the hospital overnight and was tolerating a normal diet, evaluated by PT/OT and was cleared for discharge home.  He had a Hemovac drain that was removed on postoperative day #1.  His neurologic exam postoperatively had improved as he had left upper extremity preoperatively that resolved after surgery.  Intraoperatively he did have a small CSF leak at which he had no signs of swelling of his incision postoperatively upon discharge.  I did counsel him on signs and symptoms of this to watch for.  Upon discharge, his pain was controlled on oral medication, he was tolerating a normal diet, having normal neurologic function and normal bowel and bladder function.  Treatments: Surgery -ACDF C4-C6, 09/18/2020  Discharge Exam: Blood pressure 139/77, pulse 61, temperature 98.4 F (36.9 C), temperature source Oral, resp. rate 16, height 5\' 10"  (1.778 m), weight 116.7 kg, SpO2 100 %. Awake, alert, oriented Speech fluent, appropriate CN grossly intact 5/5 BUE/BLE Wound c/d/i Trachea midline Neck soft Normal phonation Hemovac in place, serosanguineous drainage noted  Disposition:  Discharge disposition: 01-Home or Self Care       Discharge Instructions    Incentive spirometry RT   Complete by: As directed      Allergies as of 09/19/2020   No Known Allergies     Medication List    STOP taking these medications   tiZANidine 4 MG tablet Commonly known as: Zanaflex     TAKE these medications   gabapentin 300 MG capsule Commonly known as: NEURONTIN Take 1 capsule (300 mg total) by mouth at bedtime.   HYDROcodone-acetaminophen 5-325 MG tablet Commonly known as: NORCO/VICODIN Take 1 tablet by mouth every 4 (four) hours as needed for moderate pain ((score 4 to 6)).   methocarbamol 500 MG tablet Commonly known as: ROBAXIN Take 1 tablet (500 mg total) by mouth every 8 (eight) hours as needed for muscle spasms.       Follow-up Information    Lakeisha Waldrop C, DO Follow up in 10 day(s).   Why: call for appointment Contact information: [address] [place] [address] [place] [phone]               Signed: [phone] Bradon Fester 09/19/2020, 8:48 AM

## 2020-09-19 NOTE — Discharge Instructions (Signed)
SHOWER IN 3 DAYS REMOVE DRESSING IN 3 DAYS LEAVE WOUND OPEN TO AIR, NO DRESSING ONCE DRESSING OFF CALL WITH ANY QUESTIONS OR CONCERNS NO BENDING/LIFTING/TWISTING MORE THAN 10LBS

## 2020-09-25 ENCOUNTER — Encounter (HOSPITAL_COMMUNITY): Payer: Self-pay

## 2020-09-25 ENCOUNTER — Emergency Department (HOSPITAL_COMMUNITY)
Admission: EM | Admit: 2020-09-25 | Discharge: 2020-09-25 | Disposition: A | Discharge status: Home / Self Care | Payer: BC Managed Care – PPO | Attending: Emergency Medicine | Admitting: Emergency Medicine

## 2020-09-25 ENCOUNTER — Emergency Department (HOSPITAL_COMMUNITY): Payer: BC Managed Care – PPO

## 2020-09-25 ENCOUNTER — Other Ambulatory Visit: Payer: Self-pay

## 2020-09-25 DIAGNOSIS — F17290 Nicotine dependence, other tobacco product, uncomplicated: Secondary | ICD-10-CM | POA: Insufficient documentation

## 2020-09-25 DIAGNOSIS — R1310 Dysphagia, unspecified: Secondary | ICD-10-CM | POA: Insufficient documentation

## 2020-09-25 DIAGNOSIS — R070 Pain in throat: Secondary | ICD-10-CM | POA: Diagnosis present

## 2020-09-25 LAB — BASIC METABOLIC PANEL
Anion gap: 13 (ref 5–15)
BUN: 22 mg/dL — ABNORMAL HIGH (ref 6–20)
CO2: 23 mmol/L (ref 22–32)
Calcium: 9.9 mg/dL (ref 8.9–10.3)
Chloride: 101 mmol/L (ref 98–111)
Creatinine, Ser: 1.18 mg/dL (ref 0.61–1.24)
GFR, Estimated: >60 mL/min (ref >60)
Glucose, Bld: 109 mg/dL — ABNORMAL HIGH (ref 70–99)
Potassium: 4 mmol/L (ref 3.5–5.1)
Sodium: 137 mmol/L (ref 135–145)

## 2020-09-25 LAB — CBC WITH DIFFERENTIAL/PLATELET
Abs Immature Granulocytes: 0 10*3/uL (ref 0.00–0.07)
Basophils Absolute: 0.1 10*3/uL (ref 0.0–0.1)
Basophils Relative: 2 %
Eosinophils Absolute: 0.1 10*3/uL (ref 0.0–0.5)
Eosinophils Relative: 1 %
HCT: 45.8 % (ref 39.0–52.0)
Hemoglobin: 14.5 g/dL (ref 13.0–17.0)
Lymphocytes Relative: 37 %
Lymphs Abs: 1.9 10*3/uL (ref 0.7–4.0)
MCH: 28.5 pg (ref 26.0–34.0)
MCHC: 31.7 g/dL (ref 30.0–36.0)
MCV: 90.2 fL (ref 80.0–100.0)
Monocytes Absolute: 0.4 10*3/uL (ref 0.1–1.0)
Monocytes Relative: 7 %
Neutro Abs: 2.8 10*3/uL (ref 1.7–7.7)
Neutrophils Relative %: 53 %
Platelets: 294 10*3/uL (ref 150–400)
RBC: 5.08 MIL/uL (ref 4.22–5.81)
RDW: 11.9 % (ref 11.5–15.5)
WBC: 5.2 10*3/uL (ref 4.0–10.5)
nRBC: 0 % (ref 0.0–0.2)
nRBC: 0 /100 WBC

## 2020-09-25 MED ORDER — METHYLPREDNISOLONE 4 MG PO TBPK: ORAL_TABLET | ORAL | 0 refills | Status: AC

## 2020-09-25 MED ORDER — DEXAMETHASONE SODIUM PHOSPHATE 10 MG/ML IJ SOLN
10.0000 mg | Freq: Once | INTRAMUSCULAR | Status: AC
  Administered 2020-09-25: 10 mg via INTRAVENOUS
  Filled 2020-09-25: qty 1

## 2020-09-25 MED ORDER — IOHEXOL 300 MG/ML  SOLN
75.0000 mL | Freq: Once | INTRAMUSCULAR | Status: AC | PRN
  Administered 2020-09-25: 75 mL via INTRAVENOUS

## 2020-09-25 NOTE — ED Notes (Signed)
Patient transported to Ultrasound 

## 2020-09-25 NOTE — ED Provider Notes (Signed)
[facility] EMERGENCY DEPARTMENT Provider Note   CSN: [id number] Arrival date & time: 09/25/20  1629     History No chief complaint on file.   Riley Ross is a 34 y.o. male.  The history is provided by the patient and medical records.   Riley Ross is a 34 y.o. male who presents to the Emergency Department complaining of sensation of food stuck in throat. He had cervical fusion surgery on November 9 and has been in a cervical collar since that time. He has had a very low amplitude voice since the surgery. He states that he was eating chicken and rice this evening and he feels like there might be something still in his throat. He is able to swallow without difficulty but does have a sensation of something in the back of his throat. Denies any fevers, cough, shortness of breath, nausea, vomiting. Symptoms are moderate and constant nature. He does not feel any swelling in his throat.    Past Medical History:  Diagnosis Date  . Complication of anesthesia   . Family history of adverse reaction to anesthesia   . Medical history non-contributory     Patient Active Problem List   Diagnosis Date Noted  . Cervical myelopathy (HCC) 09/18/2020    Past Surgical History:  Procedure Laterality Date  . ANTERIOR CERVICAL DECOMP/DISCECTOMY FUSION N/A 09/18/2020   Procedure: Anterior Cervical Decompression Fusion Cervical four- Cervical five, Cervical five-Cervical six;  Surgeon: Bethann Goo, DO;  Location: [facility] OR;  Service: Neurosurgery;  Laterality: N/A;  . KNEE SURGERY Left    knee cap  . TONSILLECTOMY    . TYMPANOSTOMY TUBE PLACEMENT         Family History  Problem Relation Age of Onset  . Kidney disease Mother   . Carpal tunnel syndrome Mother   . Seizures Brother   . Lung disease Maternal Grandmother     Social History   Tobacco Use  . Smoking status: Current Some Day Smoker    Types: Cigars  . Smokeless tobacco: Never Used  Vaping  Use  . Vaping Use: Never used  Substance Use Topics  . Alcohol use: No  . Drug use: Yes    Frequency: 7.0 times per week    Types: Marijuana    Comment: "daily for pain to be able to go to sleep" - 09/14/2020    Home Medications Prior to Admission medications   Medication Sig Start Date End Date Taking? Authorizing Provider  gabapentin (NEURONTIN) 300 MG capsule Take 1 capsule (300 mg total) by mouth at bedtime. 07/25/20   Penumalli, Glenford Bayley, MD  HYDROcodone-acetaminophen (NORCO/VICODIN) 5-325 MG tablet Take 1 tablet by mouth every 4 (four) hours as needed for moderate pain ((score 4 to 6)). 09/19/20   Dawley, Troy C, DO  methocarbamol (ROBAXIN) 500 MG tablet Take 1 tablet (500 mg total) by mouth every 8 (eight) hours as needed for muscle spasms. 09/19/20   Dawley, Alan Mulder, DO  methylPREDNISolone (MEDROL DOSEPAK) 4 MG TBPK tablet Take according to label instructions 09/25/20   Tilden Fossa, MD    Allergies    Patient has no known allergies.  Review of Systems   Review of Systems  All other systems reviewed and are negative.   Physical Exam Updated Vital Signs BP (!) 137/98 (BP Location: Right Arm)   Pulse 88   Temp 98.3 F (36.8 C) (Oral)   Resp 16   SpO2 98%   Physical Exam Vitals  and nursing note reviewed.  Constitutional:      Appearance: He is well-developed.  HENT:     Head: Normocephalic and atraumatic.     Comments: No edema or erythema in the posterior oropharynx Neck:     Comments: Cervical collar in place. Surgical site to anterior neck clean, dry with no local erythema or edema. Cardiovascular:     Rate and Rhythm: Normal rate and regular rhythm.     Heart sounds: No murmur heard.   Pulmonary:     Effort: Pulmonary effort is normal. No respiratory distress.     Breath sounds: Normal breath sounds. No stridor.     Comments: Quiet voice Abdominal:     Palpations: Abdomen is soft.     Tenderness: There is no abdominal tenderness. There is no guarding or  rebound.  Musculoskeletal:        General: No tenderness.  Skin:    General: Skin is warm and dry.  Neurological:     Mental Status: He is alert and oriented to person, place, and time.     Comments: Five out of five strength in all four extremities  Psychiatric:        Behavior: Behavior normal.     ED Results / Procedures / Treatments   Labs (all labs ordered are listed, but only abnormal results are displayed) Labs Reviewed  BASIC METABOLIC PANEL - Abnormal; Notable for the following components:      Result Value   Glucose, Bld 109 (*)    BUN 22 (*)    All other components within normal limits  CBC WITH DIFFERENTIAL/PLATELET    EKG None  Radiology CT Soft Tissue Neck W Contrast  Result Date: 09/25/2020 CLINICAL DATA:  Recent cervical spine surgery dysphagia EXAM: CT NECK WITH CONTRAST TECHNIQUE: Multidetector CT imaging of the neck was performed using the standard protocol following the bolus administration of intravenous contrast. CONTRAST:  76mL OMNIPAQUE IOHEXOL 300 MG/ML  SOLN COMPARISON:  None. FINDINGS: Pharynx and larynx: Mild prominence of the lingual tonsils. There is some mass effect on the posterior pharyngeal wall by prevertebral soft tissue swelling. Salivary glands: Unremarkable. Thyroid: Normal. Lymph nodes: No enlarged lymph nodes. Vascular: Major neck vessels are patent. Limited intracranial: No abnormal enhancement. Visualized orbits: Not included. Mastoids and visualized paranasal sinuses: Aerated. Skeleton: Postoperative changes of anterior cervical fusion at C4-C6 with plate and screw fixation and interbody allografts. There is no evidence of hardware complication. Postoperative prevertebral edema is present. Upper chest: Included upper lungs are clear. Other: None. IMPRESSION: Persistent postoperative prevertebral edema. There is mild mass effect on the posterior pharyngeal wall possibly accounting for reported symptoms. Electronically Signed   By: Guadlupe Spanish M.D.   On: 09/25/2020 20:38    Procedures Procedures (including critical care time)  Medications Ordered in ED Medications  dexamethasone (DECADRON) injection 10 mg (has no administration in time range)  iohexol (OMNIPAQUE) 300 MG/ML solution 75 mL (75 mLs Intravenous Contrast Given 09/25/20 2023)    ED Course  I have reviewed the triage vital signs and the nursing notes.  Pertinent labs & imaging results that were available during my care of the patient were reviewed by me and considered in my medical decision making (see chart for details).    MDM Rules/Calculators/A&P                         patient here for evaluation of sensation of food stuck in his throat  after eating. He is status post cervical fusion on November 9. Wound does not appear infected. CT scan with expected mild postoperative edema. He has been evaluated by his surgeon in the emergency department. Given his symptomatic edema will treat with course of steroids. Discussed importance of return precautions.  Final Clinical Impression(s) / ED Diagnoses Final diagnoses:  Dysphagia, unspecified type    Rx / DC Orders ED Discharge Orders         Ordered    methylPREDNISolone (MEDROL DOSEPAK) 4 MG TBPK tablet        09/25/20 2103           Tilden Fossa, MD 09/25/20 2105

## 2020-09-25 NOTE — Progress Notes (Signed)
Patient seen and examined in the waiting area.  Complaining of "food stuck in his throat".  He feels as though possibly a piece of rice may be stuck in his throat.  He has been tolerating a normal diet since discharge until today.  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  He denies any fevers.  Denies any difficulty with numbness or tingling.  Denies any weakness.  Denies any wound issues.  At this time his neck is soft trachea is midline.  There is no palpable swelling that is significant.  His phonation is normal although he does have a raspy voice.  His maintaining his airway without complication.  He has no difficulty breathing.  His incision is healing well, Steri-Strips removed there is no erythema and no discharge whatsoever.  There is no fluctuance.  He is waiting to be seen in the emergency department.  He will likely need some cervical x-rays for evaluation of postoperative swelling which is the most likely etiology at this time.  I am okay with him giving him IV steroids and glucagon if needed.  He will likely need to go home on a steroid taper (Medrol Dosepak) if his work-up is negative for any acute findings.   Thank you for allowing me to participate in this patient's care.  Please do not hesitate to call with questions or concerns.   Monia Pouch, DO Neurosurgeon [facility] Cell: [phone]

## 2020-09-25 NOTE — ED Triage Notes (Signed)
Pt had cervical spine surgery on 11/9. Since then he has had a hoarse voice and now he feels like he has something stuck in his throat after eating rice and chicken today. States he can swallow liquids fine, no n/v but is worried he has something stuck. Airway intact, pt wearing [place] J collar

## 2020-09-27 ENCOUNTER — Telehealth: Payer: Self-pay | Admitting: *Deleted

## 2020-09-27 NOTE — Telephone Encounter (Signed)
Pt brother, Ladene Artist called regarding [facility] on [address] not receiving Rx and asked RNCM to call in to [facility] on [address].  RNCM called in Rx as written to pharmacy of choice.

## 2020-12-20 IMAGING — RF DG CERVICAL SPINE 2 OR 3 VIEWS
1 series · 2 of 2 positions shown · non-contrast
Comparison: 08/15/2020

CLINICAL DATA: ACDF C4-5 and C5-6

EXAM:
CERVICAL SPINE - 2-3 VIEW; DG C-ARM 1-60 MIN

[Series 1: run · 2 of 2 slices shown]
[im 1/2]
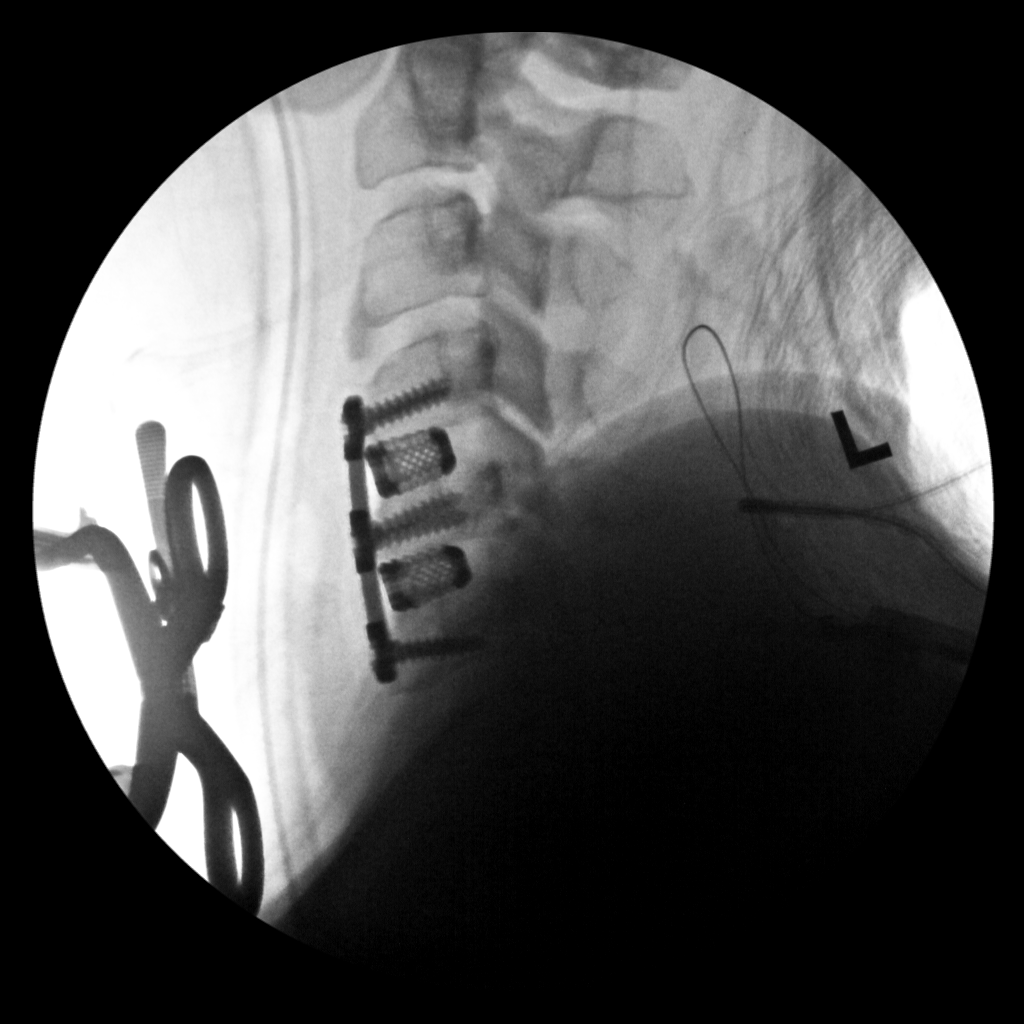
[im 2/2]
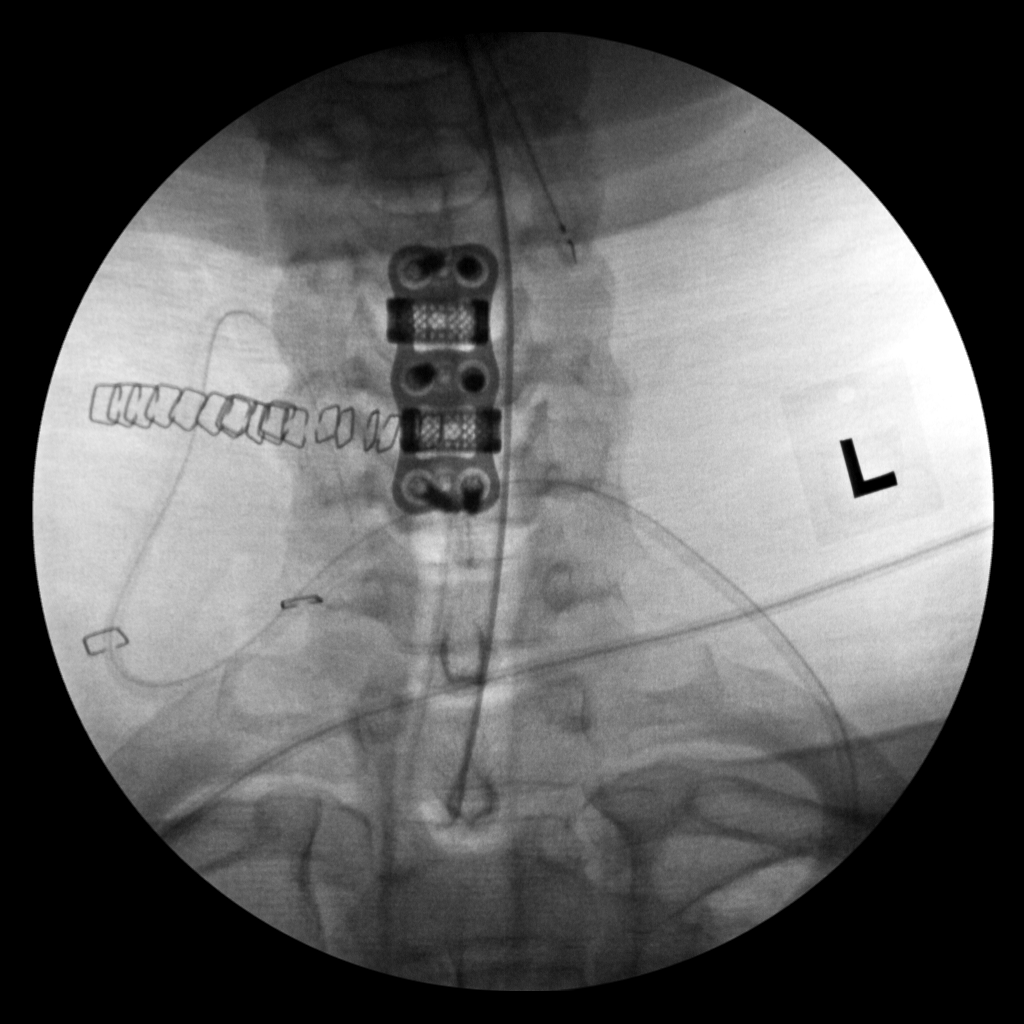

[2 of 2 positions shown; findings below may reference images not displayed]

FINDINGS: AP and lateral C-arm images were obtained. The patient is intubated
in the operating room.

ACDF C4-5 and C5-6. Anterior plate and screws and interbody cages in
good position.
IMPRESSION: Satisfactory ACDF C4-5 and C5-6.

## 2020-12-20 IMAGING — RF DG C-ARM 1-60 MIN
1 series · 2 of 2 positions shown · non-contrast
Comparison: 08/15/2020

CLINICAL DATA: ACDF C4-5 and C5-6

EXAM:
CERVICAL SPINE - 2-3 VIEW; DG C-ARM 1-60 MIN

[Series 1: run · 2 of 2 slices shown]
[im 1/2]
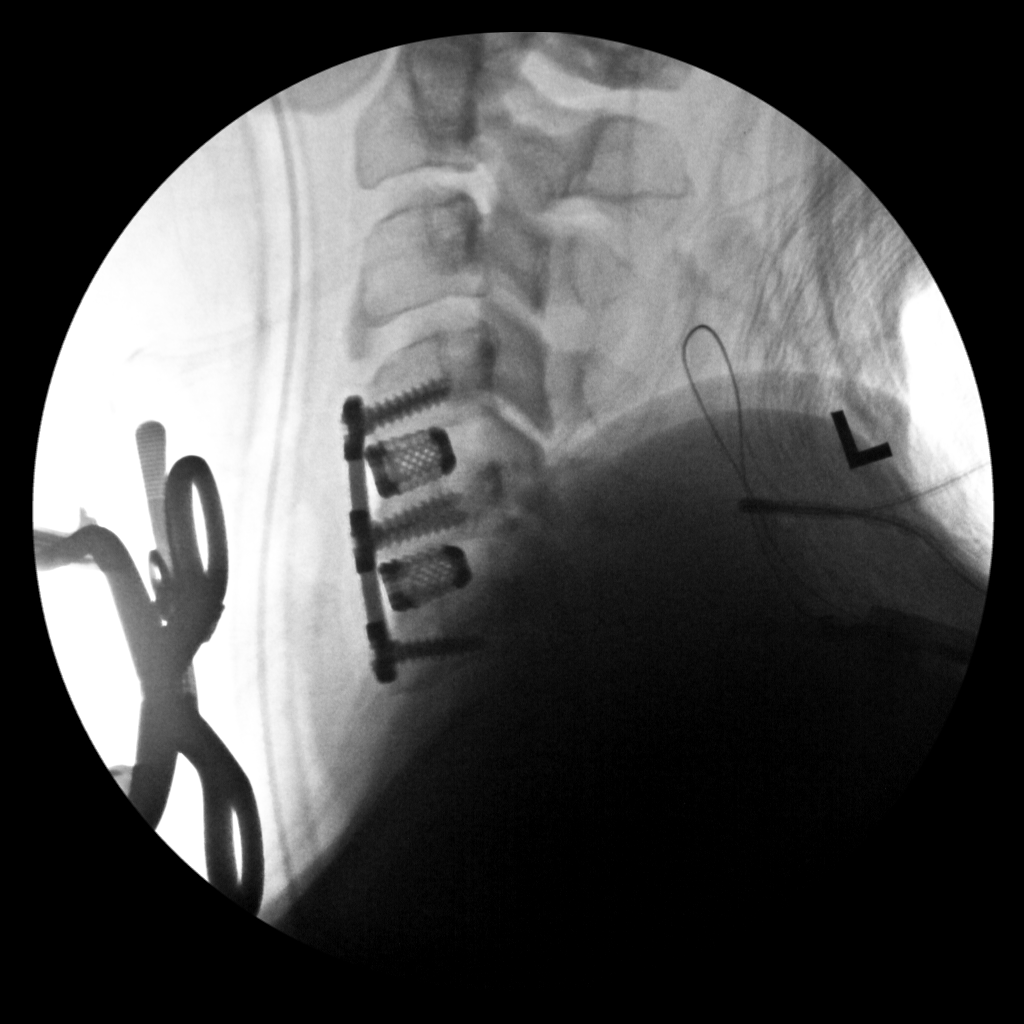
[im 2/2]
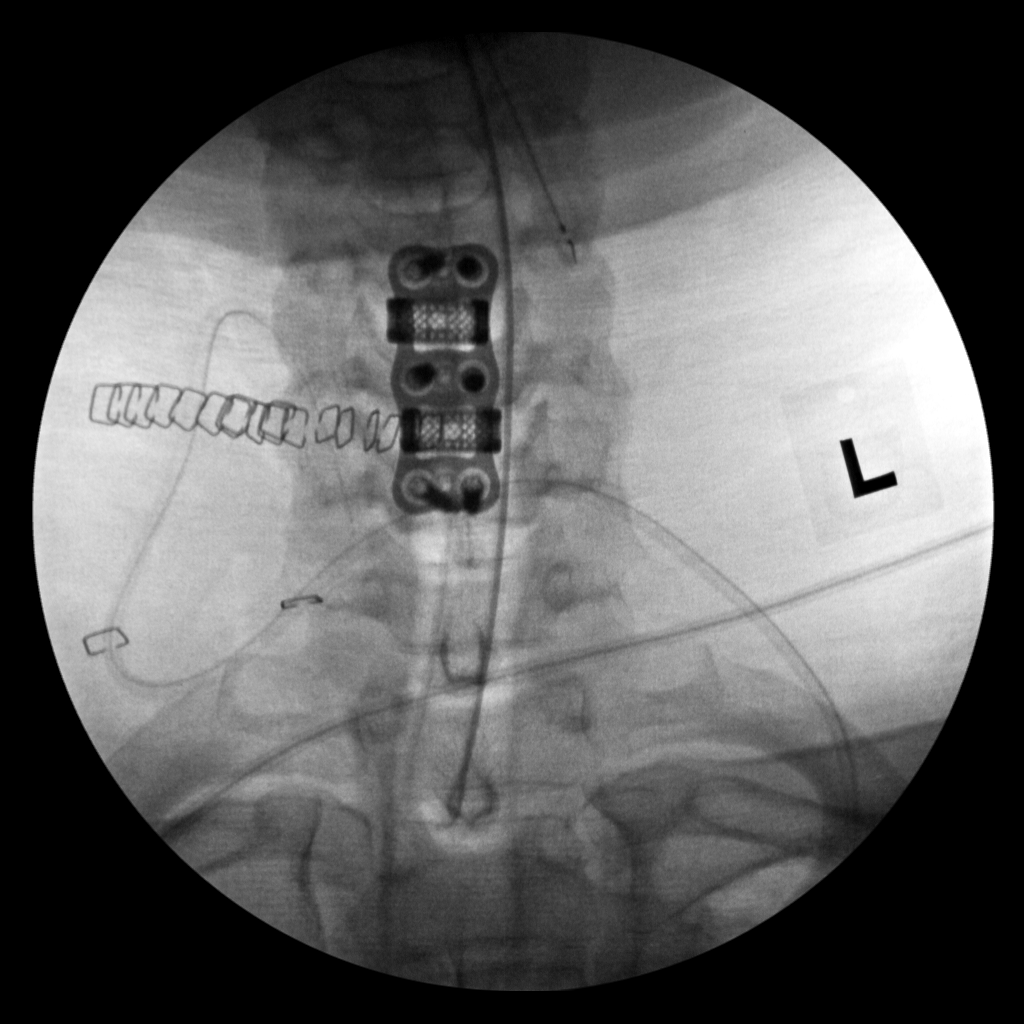

[2 of 2 positions shown; findings below may reference images not displayed]

FINDINGS: AP and lateral C-arm images were obtained. The patient is intubated
in the operating room.

ACDF C4-5 and C5-6. Anterior plate and screws and interbody cages in
good position.
IMPRESSION: Satisfactory ACDF C4-5 and C5-6.

## 2020-12-27 IMAGING — CT CT NECK W/ CM
4 of 5 series · 16 of 33 positions shown, 18 images · IV contrast (APPLIED)
Comparison: None.

CLINICAL DATA: Recent cervical spine surgery dysphagia

EXAM:
CT NECK WITH CONTRAST
TECHNIQUE: Multidetector CT imaging of the neck was performed using the
standard protocol following the bolus administration of intravenous
contrast.
CONTRAST:  75mL OMNIPAQUE IOHEXOL 300 MG/ML  SOLN

[Series 3: neck 2.0 i31s 3 · axial · 0.52mm/px · z∈[-327,-159]mm · 4 of 140 slices shown, 5 images]
[im 28/140  soft-tissue]
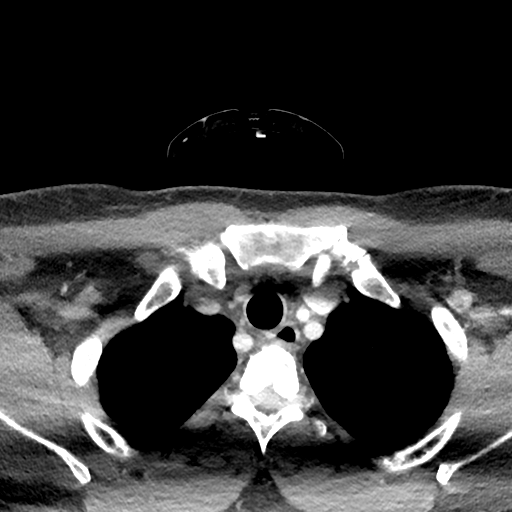
[im 28/140  bone]
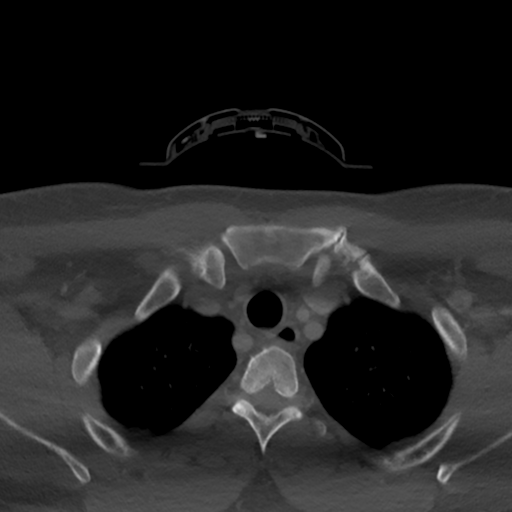
[im 56/140  bone]
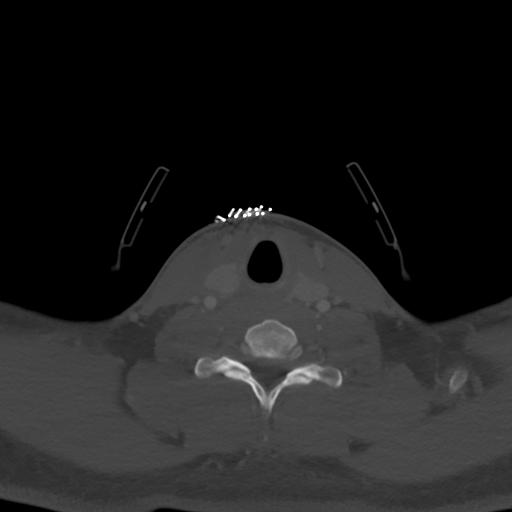
[im 84/140  bone]
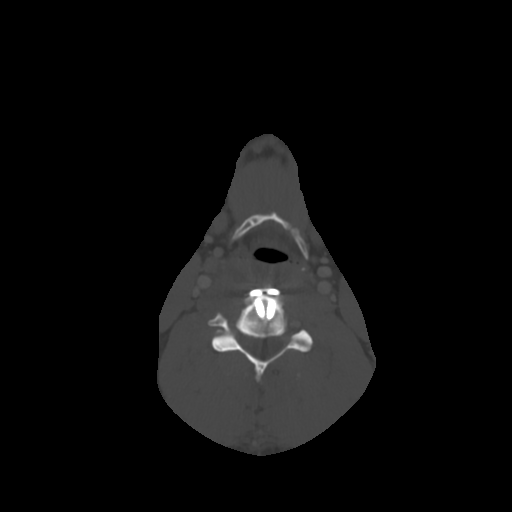
[im 112/140  bone]
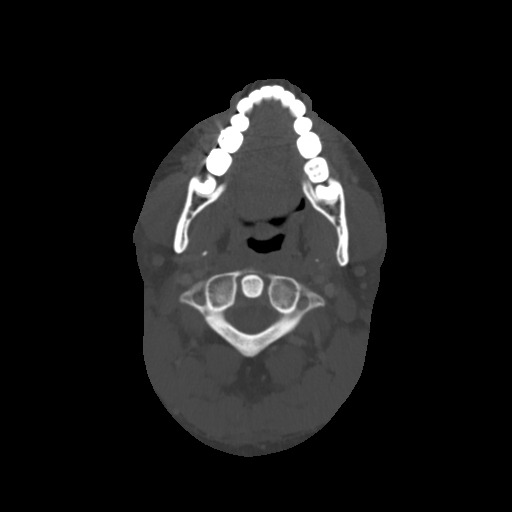

[Series 7: coronal st · coronal · 0.41mm/px · 3 of 101 slices shown]
[im 25/101  bone]
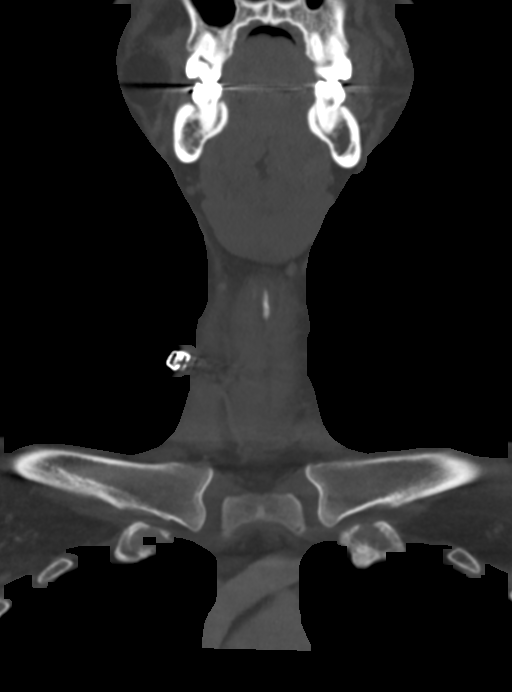
[im 42/101  bone]
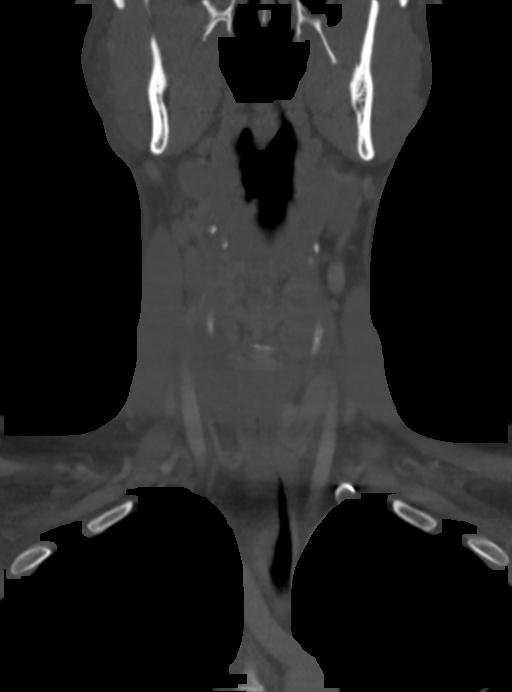
[im 59/101  bone]
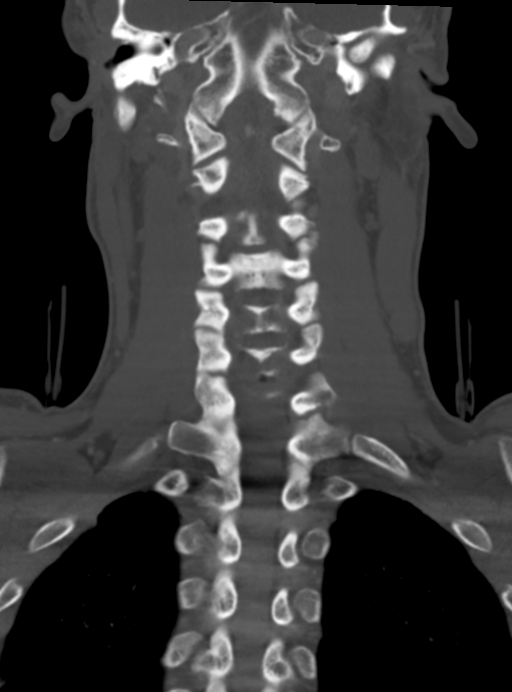

[Series 8: sagittal st · sagittal · 0.50mm/px · 5 of 79 slices shown, 6 images]
[im 27/79  bone]
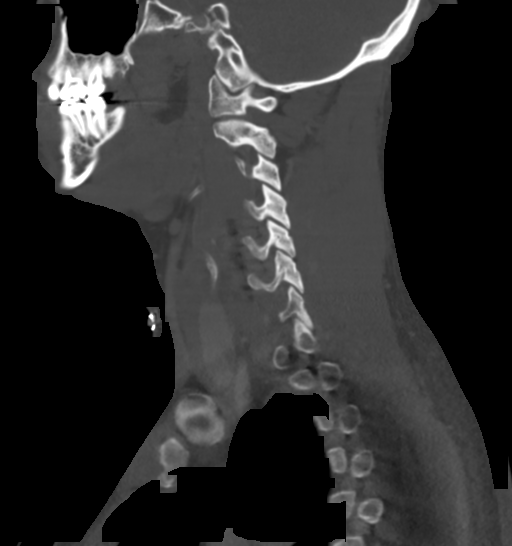
[im 33/79  bone]
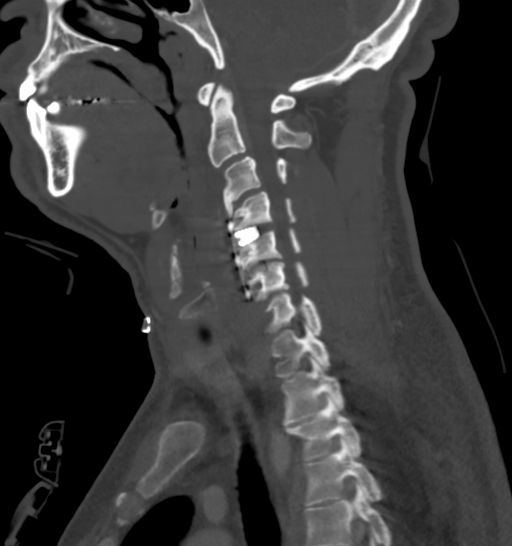
[im 40/79  soft-tissue]
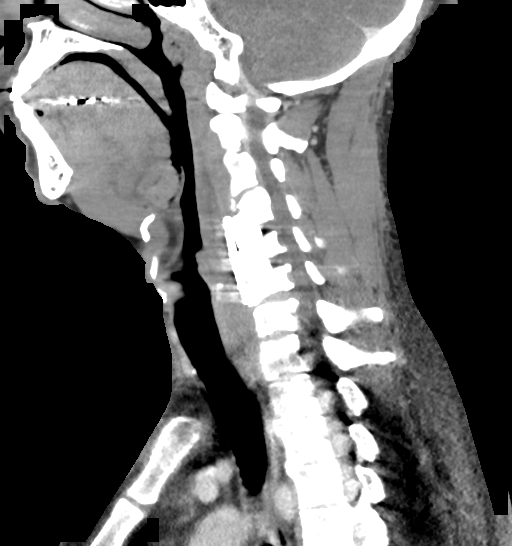
[im 40/79  bone]
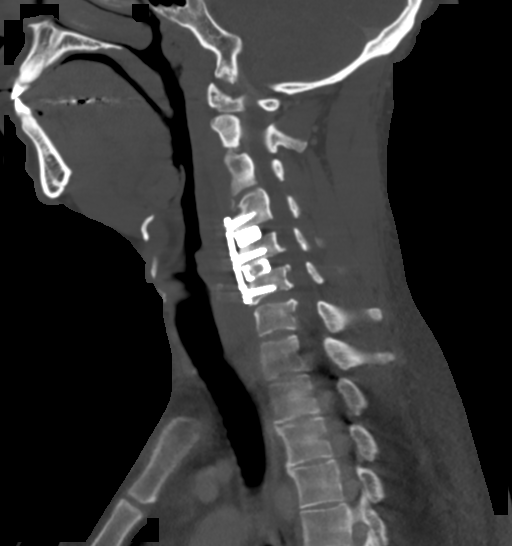
[im 46/79  bone]
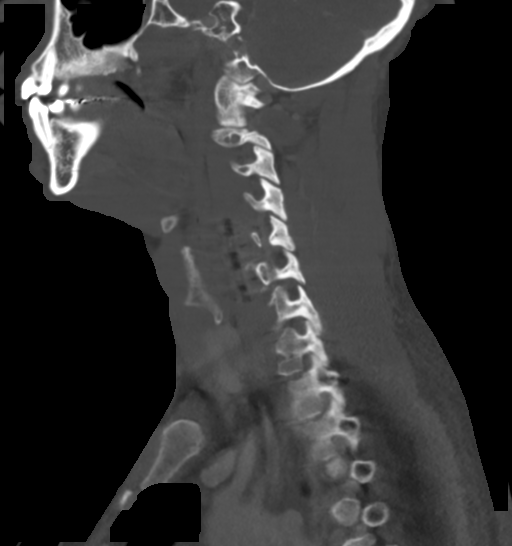
[im 53/79  bone]
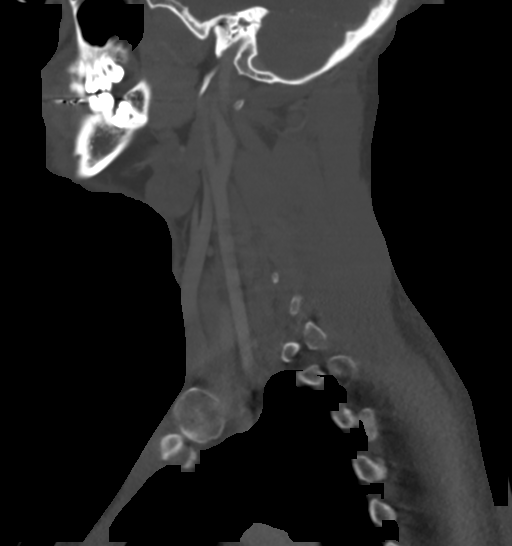

[Series 9: orthogonal st · axial · 0.39mm/px · z∈[-358,-193]mm · 4 of 148 slices shown]
[im 30/148  bone]
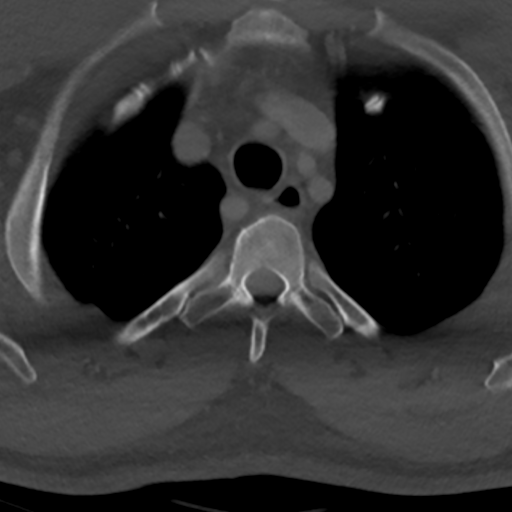
[im 59/148  bone]
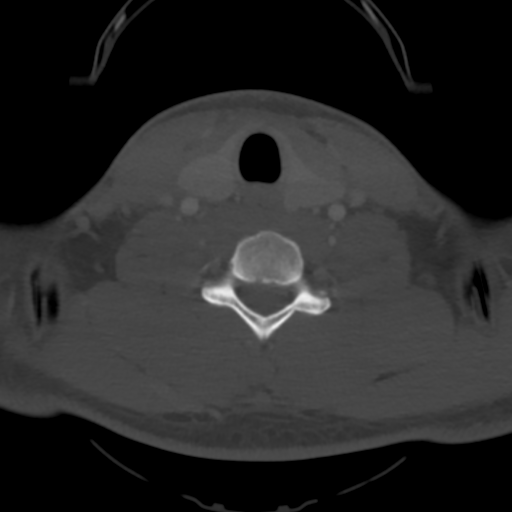
[im 89/148  bone]
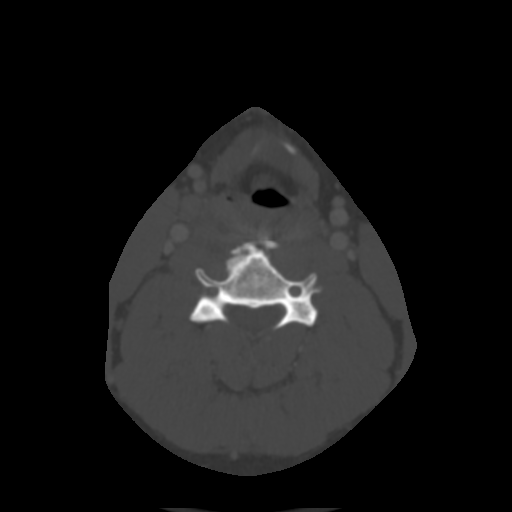
[im 118/148  bone]
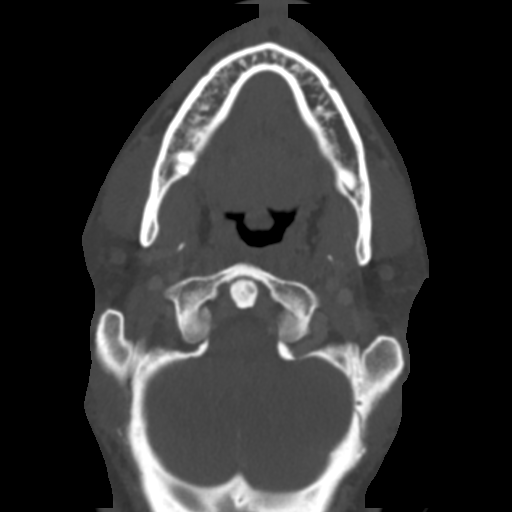

[16 of 33 positions shown; findings below may reference images not displayed]

FINDINGS: Pharynx and larynx: Mild prominence of the lingual tonsils. There is
some mass effect on the posterior pharyngeal wall by prevertebral
soft tissue swelling.

Salivary glands: Unremarkable.

Thyroid: Normal.

Lymph nodes: No enlarged lymph nodes.

Vascular: Major neck vessels are patent.

Limited intracranial: No abnormal enhancement.

Visualized orbits: Not included.

Mastoids and visualized paranasal sinuses: Aerated.

Skeleton: Postoperative changes of anterior cervical fusion at C4-C6
with plate and screw fixation and interbody allografts. There is no
evidence of hardware complication. Postoperative prevertebral edema
is present.

Upper chest: Included upper lungs are clear.

Other: None.
IMPRESSION: Persistent postoperative prevertebral edema. There is mild mass
effect on the posterior pharyngeal wall possibly accounting for
reported symptoms.
